# Patient Record
Sex: Male | Born: 1940 | Race: White | Hispanic: No
Health system: Southern US, Community
[De-identification: ages and names within clinical notes are randomized; demographics above are authoritative.]

## PROBLEM LIST (undated history)

## (undated) DIAGNOSIS — N189 Chronic kidney disease, unspecified: Secondary | ICD-10-CM

## (undated) DIAGNOSIS — C3490 Malignant neoplasm of unspecified part of unspecified bronchus or lung: Secondary | ICD-10-CM

---

## 2014-09-08 ENCOUNTER — Other Ambulatory Visit (HOSPITAL_COMMUNITY): Payer: Self-pay | Admitting: Internal Medicine

## 2014-09-08 DIAGNOSIS — C3411 Malignant neoplasm of upper lobe, right bronchus or lung: Secondary | ICD-10-CM

## 2014-09-14 ENCOUNTER — Encounter (HOSPITAL_COMMUNITY)
Admission: RE | Admit: 2014-09-14 | Discharge: 2014-09-14 | Disposition: A | Discharge status: Home / Self Care | Payer: Medicare Other | Source: Ambulatory Visit | Attending: Internal Medicine | Admitting: Internal Medicine

## 2014-09-14 DIAGNOSIS — C3411 Malignant neoplasm of upper lobe, right bronchus or lung: Secondary | ICD-10-CM | POA: Insufficient documentation

## 2014-09-14 LAB — GLUCOSE, CAPILLARY: Glucose-Capillary: 175 mg/dL — ABNORMAL HIGH (ref 70–99)

## 2014-09-14 MED ORDER — FLUDEOXYGLUCOSE F - 18 (FDG) INJECTION
11.3000 | Freq: Once | INTRAVENOUS | Status: AC | PRN
  Administered 2014-09-14: 11.3 via INTRAVENOUS

## 2015-01-05 ENCOUNTER — Other Ambulatory Visit (HOSPITAL_COMMUNITY): Payer: Self-pay | Admitting: Internal Medicine

## 2015-01-05 DIAGNOSIS — C3490 Malignant neoplasm of unspecified part of unspecified bronchus or lung: Secondary | ICD-10-CM

## 2015-02-07 ENCOUNTER — Ambulatory Visit (HOSPITAL_COMMUNITY): Payer: Medicare Other

## 2015-02-16 ENCOUNTER — Ambulatory Visit (HOSPITAL_COMMUNITY)
Admission: RE | Admit: 2015-02-16 | Discharge: 2015-02-16 | Disposition: A | Discharge status: Home / Self Care | Payer: Medicare Other | Source: Ambulatory Visit | Attending: Internal Medicine | Admitting: Internal Medicine

## 2015-02-16 DIAGNOSIS — C3490 Malignant neoplasm of unspecified part of unspecified bronchus or lung: Secondary | ICD-10-CM

## 2015-02-16 DIAGNOSIS — R911 Solitary pulmonary nodule: Secondary | ICD-10-CM | POA: Diagnosis not present

## 2015-02-16 DIAGNOSIS — D3501 Benign neoplasm of right adrenal gland: Secondary | ICD-10-CM | POA: Diagnosis not present

## 2015-02-16 LAB — GLUCOSE, CAPILLARY: Glucose-Capillary: 162 mg/dL — ABNORMAL HIGH (ref 65–99)

## 2015-02-16 MED ORDER — FLUDEOXYGLUCOSE F - 18 (FDG) INJECTION
11.8700 | Freq: Once | INTRAVENOUS | Status: DC | PRN
  Administered 2015-02-16: 11.87 via INTRAVENOUS
  Filled 2015-02-16: qty 11.87

## 2015-08-08 DIAGNOSIS — I10 Essential (primary) hypertension: Secondary | ICD-10-CM | POA: Diagnosis not present

## 2015-08-08 DIAGNOSIS — H8113 Benign paroxysmal vertigo, bilateral: Secondary | ICD-10-CM | POA: Diagnosis not present

## 2015-08-08 DIAGNOSIS — Z6828 Body mass index (BMI) 28.0-28.9, adult: Secondary | ICD-10-CM | POA: Diagnosis not present

## 2015-08-08 DIAGNOSIS — E1121 Type 2 diabetes mellitus with diabetic nephropathy: Secondary | ICD-10-CM | POA: Diagnosis not present

## 2015-08-08 DIAGNOSIS — E1129 Type 2 diabetes mellitus with other diabetic kidney complication: Secondary | ICD-10-CM | POA: Diagnosis not present

## 2015-08-21 DIAGNOSIS — E119 Type 2 diabetes mellitus without complications: Secondary | ICD-10-CM | POA: Diagnosis not present

## 2015-09-14 DIAGNOSIS — R918 Other nonspecific abnormal finding of lung field: Secondary | ICD-10-CM | POA: Diagnosis not present

## 2015-09-14 DIAGNOSIS — Z9221 Personal history of antineoplastic chemotherapy: Secondary | ICD-10-CM | POA: Diagnosis not present

## 2015-09-14 DIAGNOSIS — N62 Hypertrophy of breast: Secondary | ICD-10-CM | POA: Diagnosis not present

## 2015-09-14 DIAGNOSIS — J90 Pleural effusion, not elsewhere classified: Secondary | ICD-10-CM | POA: Diagnosis not present

## 2015-09-14 DIAGNOSIS — I2510 Atherosclerotic heart disease of native coronary artery without angina pectoris: Secondary | ICD-10-CM | POA: Diagnosis not present

## 2015-09-14 DIAGNOSIS — C3490 Malignant neoplasm of unspecified part of unspecified bronchus or lung: Secondary | ICD-10-CM | POA: Diagnosis not present

## 2015-09-14 DIAGNOSIS — Z923 Personal history of irradiation: Secondary | ICD-10-CM | POA: Diagnosis not present

## 2015-09-21 DIAGNOSIS — Z9221 Personal history of antineoplastic chemotherapy: Secondary | ICD-10-CM | POA: Diagnosis not present

## 2015-09-21 DIAGNOSIS — Z923 Personal history of irradiation: Secondary | ICD-10-CM | POA: Diagnosis not present

## 2015-09-21 DIAGNOSIS — N183 Chronic kidney disease, stage 3 (moderate): Secondary | ICD-10-CM | POA: Diagnosis not present

## 2015-09-21 DIAGNOSIS — C3490 Malignant neoplasm of unspecified part of unspecified bronchus or lung: Secondary | ICD-10-CM | POA: Diagnosis not present

## 2015-11-07 DIAGNOSIS — I10 Essential (primary) hypertension: Secondary | ICD-10-CM | POA: Diagnosis not present

## 2015-11-07 DIAGNOSIS — E1121 Type 2 diabetes mellitus with diabetic nephropathy: Secondary | ICD-10-CM | POA: Diagnosis not present

## 2015-11-07 DIAGNOSIS — Z6828 Body mass index (BMI) 28.0-28.9, adult: Secondary | ICD-10-CM | POA: Diagnosis not present

## 2015-11-29 DIAGNOSIS — E119 Type 2 diabetes mellitus without complications: Secondary | ICD-10-CM | POA: Diagnosis not present

## 2016-02-08 DIAGNOSIS — Z1389 Encounter for screening for other disorder: Secondary | ICD-10-CM | POA: Diagnosis not present

## 2016-02-08 DIAGNOSIS — N183 Chronic kidney disease, stage 3 (moderate): Secondary | ICD-10-CM | POA: Diagnosis not present

## 2016-02-08 DIAGNOSIS — E784 Other hyperlipidemia: Secondary | ICD-10-CM | POA: Diagnosis not present

## 2016-02-08 DIAGNOSIS — C3431 Malignant neoplasm of lower lobe, right bronchus or lung: Secondary | ICD-10-CM | POA: Diagnosis not present

## 2016-02-08 DIAGNOSIS — E1122 Type 2 diabetes mellitus with diabetic chronic kidney disease: Secondary | ICD-10-CM | POA: Diagnosis not present

## 2016-02-08 DIAGNOSIS — I10 Essential (primary) hypertension: Secondary | ICD-10-CM | POA: Diagnosis not present

## 2016-02-08 DIAGNOSIS — J440 Chronic obstructive pulmonary disease with acute lower respiratory infection: Secondary | ICD-10-CM | POA: Diagnosis not present

## 2016-02-08 DIAGNOSIS — Z0000 Encounter for general adult medical examination without abnormal findings: Secondary | ICD-10-CM | POA: Diagnosis not present

## 2016-02-08 DIAGNOSIS — Z125 Encounter for screening for malignant neoplasm of prostate: Secondary | ICD-10-CM | POA: Diagnosis not present

## 2016-02-08 DIAGNOSIS — Z6829 Body mass index (BMI) 29.0-29.9, adult: Secondary | ICD-10-CM | POA: Diagnosis not present

## 2016-02-20 DIAGNOSIS — R1084 Generalized abdominal pain: Secondary | ICD-10-CM | POA: Diagnosis not present

## 2016-02-20 DIAGNOSIS — N200 Calculus of kidney: Secondary | ICD-10-CM | POA: Diagnosis not present

## 2016-02-20 DIAGNOSIS — I700 Atherosclerosis of aorta: Secondary | ICD-10-CM | POA: Diagnosis not present

## 2016-02-20 DIAGNOSIS — Z136 Encounter for screening for cardiovascular disorders: Secondary | ICD-10-CM | POA: Diagnosis not present

## 2016-02-20 DIAGNOSIS — Z87891 Personal history of nicotine dependence: Secondary | ICD-10-CM | POA: Diagnosis not present

## 2016-02-20 DIAGNOSIS — Z6828 Body mass index (BMI) 28.0-28.9, adult: Secondary | ICD-10-CM | POA: Diagnosis not present

## 2016-02-20 DIAGNOSIS — I77811 Abdominal aortic ectasia: Secondary | ICD-10-CM | POA: Diagnosis not present

## 2016-02-20 DIAGNOSIS — Z8249 Family history of ischemic heart disease and other diseases of the circulatory system: Secondary | ICD-10-CM | POA: Diagnosis not present

## 2016-02-23 DIAGNOSIS — R1032 Left lower quadrant pain: Secondary | ICD-10-CM | POA: Diagnosis not present

## 2016-02-23 DIAGNOSIS — K5790 Diverticulosis of intestine, part unspecified, without perforation or abscess without bleeding: Secondary | ICD-10-CM | POA: Diagnosis not present

## 2016-02-23 DIAGNOSIS — Z806 Family history of leukemia: Secondary | ICD-10-CM | POA: Diagnosis not present

## 2016-02-23 DIAGNOSIS — Z85118 Personal history of other malignant neoplasm of bronchus and lung: Secondary | ICD-10-CM | POA: Diagnosis not present

## 2016-02-23 DIAGNOSIS — J9383 Other pneumothorax: Secondary | ICD-10-CM | POA: Diagnosis not present

## 2016-02-23 DIAGNOSIS — N189 Chronic kidney disease, unspecified: Secondary | ICD-10-CM | POA: Diagnosis not present

## 2016-02-23 DIAGNOSIS — Z8489 Family history of other specified conditions: Secondary | ICD-10-CM | POA: Diagnosis not present

## 2016-02-23 DIAGNOSIS — E785 Hyperlipidemia, unspecified: Secondary | ICD-10-CM | POA: Diagnosis not present

## 2016-02-23 DIAGNOSIS — N132 Hydronephrosis with renal and ureteral calculous obstruction: Secondary | ICD-10-CM | POA: Diagnosis not present

## 2016-02-23 DIAGNOSIS — I129 Hypertensive chronic kidney disease with stage 1 through stage 4 chronic kidney disease, or unspecified chronic kidney disease: Secondary | ICD-10-CM | POA: Diagnosis not present

## 2016-02-23 DIAGNOSIS — R911 Solitary pulmonary nodule: Secondary | ICD-10-CM | POA: Diagnosis not present

## 2016-02-23 DIAGNOSIS — J449 Chronic obstructive pulmonary disease, unspecified: Secondary | ICD-10-CM | POA: Diagnosis not present

## 2016-02-23 DIAGNOSIS — N183 Chronic kidney disease, stage 3 (moderate): Secondary | ICD-10-CM | POA: Diagnosis not present

## 2016-02-23 DIAGNOSIS — E1122 Type 2 diabetes mellitus with diabetic chronic kidney disease: Secondary | ICD-10-CM | POA: Diagnosis not present

## 2016-02-23 DIAGNOSIS — N201 Calculus of ureter: Secondary | ICD-10-CM | POA: Diagnosis not present

## 2016-02-23 DIAGNOSIS — J90 Pleural effusion, not elsewhere classified: Secondary | ICD-10-CM | POA: Diagnosis not present

## 2016-02-24 DIAGNOSIS — N19 Unspecified kidney failure: Secondary | ICD-10-CM | POA: Diagnosis not present

## 2016-02-24 DIAGNOSIS — J449 Chronic obstructive pulmonary disease, unspecified: Secondary | ICD-10-CM | POA: Diagnosis not present

## 2016-02-24 DIAGNOSIS — N201 Calculus of ureter: Secondary | ICD-10-CM | POA: Diagnosis not present

## 2016-02-27 DIAGNOSIS — E119 Type 2 diabetes mellitus without complications: Secondary | ICD-10-CM | POA: Diagnosis not present

## 2016-03-05 DIAGNOSIS — N200 Calculus of kidney: Secondary | ICD-10-CM | POA: Diagnosis not present

## 2016-03-05 DIAGNOSIS — Z960 Presence of urogenital implants: Secondary | ICD-10-CM | POA: Diagnosis not present

## 2016-03-13 DIAGNOSIS — Z466 Encounter for fitting and adjustment of urinary device: Secondary | ICD-10-CM | POA: Diagnosis not present

## 2016-03-13 DIAGNOSIS — N201 Calculus of ureter: Secondary | ICD-10-CM | POA: Diagnosis not present

## 2016-03-13 DIAGNOSIS — E7800 Pure hypercholesterolemia, unspecified: Secondary | ICD-10-CM | POA: Diagnosis not present

## 2016-03-13 DIAGNOSIS — Z960 Presence of urogenital implants: Secondary | ICD-10-CM | POA: Diagnosis not present

## 2016-03-13 DIAGNOSIS — N200 Calculus of kidney: Secondary | ICD-10-CM | POA: Diagnosis not present

## 2016-03-13 DIAGNOSIS — Z79899 Other long term (current) drug therapy: Secondary | ICD-10-CM | POA: Diagnosis not present

## 2016-03-13 DIAGNOSIS — I10 Essential (primary) hypertension: Secondary | ICD-10-CM | POA: Diagnosis not present

## 2016-03-13 DIAGNOSIS — E119 Type 2 diabetes mellitus without complications: Secondary | ICD-10-CM | POA: Diagnosis not present

## 2016-03-13 DIAGNOSIS — Z794 Long term (current) use of insulin: Secondary | ICD-10-CM | POA: Diagnosis not present

## 2016-03-13 DIAGNOSIS — Z87442 Personal history of urinary calculi: Secondary | ICD-10-CM | POA: Diagnosis not present

## 2016-03-13 DIAGNOSIS — Z85118 Personal history of other malignant neoplasm of bronchus and lung: Secondary | ICD-10-CM | POA: Diagnosis not present

## 2016-03-21 DIAGNOSIS — Z9221 Personal history of antineoplastic chemotherapy: Secondary | ICD-10-CM | POA: Diagnosis not present

## 2016-03-21 DIAGNOSIS — J189 Pneumonia, unspecified organism: Secondary | ICD-10-CM | POA: Diagnosis not present

## 2016-03-21 DIAGNOSIS — Z923 Personal history of irradiation: Secondary | ICD-10-CM | POA: Diagnosis not present

## 2016-03-21 DIAGNOSIS — D3502 Benign neoplasm of left adrenal gland: Secondary | ICD-10-CM | POA: Diagnosis not present

## 2016-03-21 DIAGNOSIS — C3431 Malignant neoplasm of lower lobe, right bronchus or lung: Secondary | ICD-10-CM | POA: Diagnosis not present

## 2016-03-21 DIAGNOSIS — D3501 Benign neoplasm of right adrenal gland: Secondary | ICD-10-CM | POA: Diagnosis not present

## 2016-03-21 DIAGNOSIS — I272 Other secondary pulmonary hypertension: Secondary | ICD-10-CM | POA: Diagnosis not present

## 2016-03-21 DIAGNOSIS — I700 Atherosclerosis of aorta: Secondary | ICD-10-CM | POA: Diagnosis not present

## 2016-03-21 DIAGNOSIS — I313 Pericardial effusion (noninflammatory): Secondary | ICD-10-CM | POA: Diagnosis not present

## 2016-03-27 DIAGNOSIS — J90 Pleural effusion, not elsewhere classified: Secondary | ICD-10-CM | POA: Diagnosis not present

## 2016-04-08 DIAGNOSIS — Z23 Encounter for immunization: Secondary | ICD-10-CM | POA: Diagnosis not present

## 2016-04-26 DIAGNOSIS — N200 Calculus of kidney: Secondary | ICD-10-CM | POA: Diagnosis not present

## 2016-04-26 DIAGNOSIS — C3490 Malignant neoplasm of unspecified part of unspecified bronchus or lung: Secondary | ICD-10-CM | POA: Diagnosis not present

## 2016-04-30 DIAGNOSIS — N209 Urinary calculus, unspecified: Secondary | ICD-10-CM | POA: Diagnosis not present

## 2016-04-30 DIAGNOSIS — N200 Calculus of kidney: Secondary | ICD-10-CM | POA: Diagnosis not present

## 2016-05-02 DIAGNOSIS — N200 Calculus of kidney: Secondary | ICD-10-CM | POA: Diagnosis not present

## 2016-05-06 DIAGNOSIS — E1122 Type 2 diabetes mellitus with diabetic chronic kidney disease: Secondary | ICD-10-CM | POA: Diagnosis not present

## 2016-05-06 DIAGNOSIS — E784 Other hyperlipidemia: Secondary | ICD-10-CM | POA: Diagnosis not present

## 2016-05-06 DIAGNOSIS — Z6829 Body mass index (BMI) 29.0-29.9, adult: Secondary | ICD-10-CM | POA: Diagnosis not present

## 2016-05-06 DIAGNOSIS — I10 Essential (primary) hypertension: Secondary | ICD-10-CM | POA: Diagnosis not present

## 2016-06-22 DIAGNOSIS — M25422 Effusion, left elbow: Secondary | ICD-10-CM | POA: Diagnosis not present

## 2016-06-22 DIAGNOSIS — S59902A Unspecified injury of left elbow, initial encounter: Secondary | ICD-10-CM | POA: Diagnosis not present

## 2016-06-25 DIAGNOSIS — E119 Type 2 diabetes mellitus without complications: Secondary | ICD-10-CM | POA: Diagnosis not present

## 2016-06-25 DIAGNOSIS — Z6829 Body mass index (BMI) 29.0-29.9, adult: Secondary | ICD-10-CM | POA: Diagnosis not present

## 2016-06-25 DIAGNOSIS — M25522 Pain in left elbow: Secondary | ICD-10-CM | POA: Diagnosis not present

## 2016-07-08 DIAGNOSIS — N200 Calculus of kidney: Secondary | ICD-10-CM | POA: Diagnosis not present

## 2016-08-05 DIAGNOSIS — E784 Other hyperlipidemia: Secondary | ICD-10-CM | POA: Diagnosis not present

## 2016-08-05 DIAGNOSIS — M1711 Unilateral primary osteoarthritis, right knee: Secondary | ICD-10-CM | POA: Diagnosis not present

## 2016-08-05 DIAGNOSIS — E1122 Type 2 diabetes mellitus with diabetic chronic kidney disease: Secondary | ICD-10-CM | POA: Diagnosis not present

## 2016-08-05 DIAGNOSIS — M545 Low back pain: Secondary | ICD-10-CM | POA: Diagnosis not present

## 2016-08-05 DIAGNOSIS — E1165 Type 2 diabetes mellitus with hyperglycemia: Secondary | ICD-10-CM | POA: Diagnosis not present

## 2016-08-05 DIAGNOSIS — I10 Essential (primary) hypertension: Secondary | ICD-10-CM | POA: Diagnosis not present

## 2016-10-03 DIAGNOSIS — E784 Other hyperlipidemia: Secondary | ICD-10-CM | POA: Diagnosis not present

## 2016-10-03 DIAGNOSIS — J440 Chronic obstructive pulmonary disease with acute lower respiratory infection: Secondary | ICD-10-CM | POA: Diagnosis not present

## 2016-10-03 DIAGNOSIS — M545 Low back pain: Secondary | ICD-10-CM | POA: Diagnosis not present

## 2016-10-03 DIAGNOSIS — E1122 Type 2 diabetes mellitus with diabetic chronic kidney disease: Secondary | ICD-10-CM | POA: Diagnosis not present

## 2016-12-05 DIAGNOSIS — E1122 Type 2 diabetes mellitus with diabetic chronic kidney disease: Secondary | ICD-10-CM | POA: Diagnosis not present

## 2016-12-05 DIAGNOSIS — J168 Pneumonia due to other specified infectious organisms: Secondary | ICD-10-CM | POA: Diagnosis not present

## 2016-12-05 DIAGNOSIS — J189 Pneumonia, unspecified organism: Secondary | ICD-10-CM | POA: Diagnosis not present

## 2016-12-05 DIAGNOSIS — N183 Chronic kidney disease, stage 3 (moderate): Secondary | ICD-10-CM | POA: Diagnosis not present

## 2016-12-05 DIAGNOSIS — I130 Hypertensive heart and chronic kidney disease with heart failure and stage 1 through stage 4 chronic kidney disease, or unspecified chronic kidney disease: Secondary | ICD-10-CM | POA: Diagnosis not present

## 2016-12-05 DIAGNOSIS — C3491 Malignant neoplasm of unspecified part of right bronchus or lung: Secondary | ICD-10-CM | POA: Diagnosis not present

## 2016-12-05 DIAGNOSIS — E785 Hyperlipidemia, unspecified: Secondary | ICD-10-CM | POA: Diagnosis not present

## 2016-12-05 DIAGNOSIS — Z794 Long term (current) use of insulin: Secondary | ICD-10-CM | POA: Diagnosis not present

## 2016-12-05 DIAGNOSIS — Z79891 Long term (current) use of opiate analgesic: Secondary | ICD-10-CM | POA: Diagnosis not present

## 2016-12-05 DIAGNOSIS — J440 Chronic obstructive pulmonary disease with acute lower respiratory infection: Secondary | ICD-10-CM | POA: Diagnosis not present

## 2016-12-05 DIAGNOSIS — I5031 Acute diastolic (congestive) heart failure: Secondary | ICD-10-CM | POA: Diagnosis not present

## 2016-12-05 DIAGNOSIS — J449 Chronic obstructive pulmonary disease, unspecified: Secondary | ICD-10-CM | POA: Diagnosis not present

## 2016-12-05 DIAGNOSIS — Z85118 Personal history of other malignant neoplasm of bronchus and lung: Secondary | ICD-10-CM | POA: Diagnosis not present

## 2016-12-05 DIAGNOSIS — R0602 Shortness of breath: Secondary | ICD-10-CM | POA: Diagnosis not present

## 2016-12-06 DIAGNOSIS — J449 Chronic obstructive pulmonary disease, unspecified: Secondary | ICD-10-CM | POA: Diagnosis not present

## 2016-12-06 DIAGNOSIS — J440 Chronic obstructive pulmonary disease with acute lower respiratory infection: Secondary | ICD-10-CM | POA: Diagnosis not present

## 2016-12-06 DIAGNOSIS — I5031 Acute diastolic (congestive) heart failure: Secondary | ICD-10-CM | POA: Diagnosis not present

## 2016-12-06 DIAGNOSIS — N183 Chronic kidney disease, stage 3 (moderate): Secondary | ICD-10-CM | POA: Diagnosis not present

## 2016-12-06 DIAGNOSIS — J168 Pneumonia due to other specified infectious organisms: Secondary | ICD-10-CM | POA: Diagnosis not present

## 2016-12-09 DIAGNOSIS — J449 Chronic obstructive pulmonary disease, unspecified: Secondary | ICD-10-CM | POA: Diagnosis not present

## 2016-12-09 DIAGNOSIS — C3491 Malignant neoplasm of unspecified part of right bronchus or lung: Secondary | ICD-10-CM | POA: Diagnosis not present

## 2016-12-10 DIAGNOSIS — J168 Pneumonia due to other specified infectious organisms: Secondary | ICD-10-CM | POA: Diagnosis not present

## 2016-12-10 DIAGNOSIS — J440 Chronic obstructive pulmonary disease with acute lower respiratory infection: Secondary | ICD-10-CM | POA: Diagnosis not present

## 2016-12-10 DIAGNOSIS — I5031 Acute diastolic (congestive) heart failure: Secondary | ICD-10-CM | POA: Diagnosis not present

## 2016-12-10 DIAGNOSIS — N183 Chronic kidney disease, stage 3 (moderate): Secondary | ICD-10-CM | POA: Diagnosis not present

## 2016-12-12 DIAGNOSIS — I5040 Unspecified combined systolic (congestive) and diastolic (congestive) heart failure: Secondary | ICD-10-CM | POA: Diagnosis not present

## 2016-12-12 DIAGNOSIS — D0220 Carcinoma in situ of unspecified bronchus and lung: Secondary | ICD-10-CM | POA: Diagnosis not present

## 2016-12-13 DIAGNOSIS — C3490 Malignant neoplasm of unspecified part of unspecified bronchus or lung: Secondary | ICD-10-CM | POA: Diagnosis not present

## 2016-12-13 DIAGNOSIS — N183 Chronic kidney disease, stage 3 (moderate): Secondary | ICD-10-CM | POA: Diagnosis not present

## 2016-12-13 DIAGNOSIS — I130 Hypertensive heart and chronic kidney disease with heart failure and stage 1 through stage 4 chronic kidney disease, or unspecified chronic kidney disease: Secondary | ICD-10-CM | POA: Diagnosis not present

## 2016-12-13 DIAGNOSIS — Z923 Personal history of irradiation: Secondary | ICD-10-CM | POA: Diagnosis not present

## 2016-12-13 DIAGNOSIS — J440 Chronic obstructive pulmonary disease with acute lower respiratory infection: Secondary | ICD-10-CM | POA: Diagnosis not present

## 2016-12-13 DIAGNOSIS — Z9221 Personal history of antineoplastic chemotherapy: Secondary | ICD-10-CM | POA: Diagnosis not present

## 2016-12-13 DIAGNOSIS — J189 Pneumonia, unspecified organism: Secondary | ICD-10-CM | POA: Diagnosis not present

## 2016-12-13 DIAGNOSIS — I509 Heart failure, unspecified: Secondary | ICD-10-CM | POA: Diagnosis not present

## 2016-12-13 DIAGNOSIS — Z794 Long term (current) use of insulin: Secondary | ICD-10-CM | POA: Diagnosis not present

## 2016-12-13 DIAGNOSIS — E1122 Type 2 diabetes mellitus with diabetic chronic kidney disease: Secondary | ICD-10-CM | POA: Diagnosis not present

## 2016-12-19 DIAGNOSIS — J158 Pneumonia due to other specified bacteria: Secondary | ICD-10-CM | POA: Diagnosis not present

## 2016-12-19 DIAGNOSIS — D0220 Carcinoma in situ of unspecified bronchus and lung: Secondary | ICD-10-CM | POA: Diagnosis not present

## 2016-12-19 DIAGNOSIS — I5040 Unspecified combined systolic (congestive) and diastolic (congestive) heart failure: Secondary | ICD-10-CM | POA: Diagnosis not present

## 2016-12-19 DIAGNOSIS — I10 Essential (primary) hypertension: Secondary | ICD-10-CM | POA: Diagnosis not present

## 2016-12-19 DIAGNOSIS — I5031 Acute diastolic (congestive) heart failure: Secondary | ICD-10-CM | POA: Diagnosis not present

## 2016-12-19 DIAGNOSIS — N183 Chronic kidney disease, stage 3 (moderate): Secondary | ICD-10-CM | POA: Diagnosis not present

## 2016-12-19 DIAGNOSIS — Z6828 Body mass index (BMI) 28.0-28.9, adult: Secondary | ICD-10-CM | POA: Diagnosis not present

## 2016-12-25 DIAGNOSIS — E1165 Type 2 diabetes mellitus with hyperglycemia: Secondary | ICD-10-CM | POA: Diagnosis not present

## 2016-12-25 DIAGNOSIS — E119 Type 2 diabetes mellitus without complications: Secondary | ICD-10-CM | POA: Diagnosis not present

## 2017-01-03 DIAGNOSIS — J158 Pneumonia due to other specified bacteria: Secondary | ICD-10-CM | POA: Diagnosis not present

## 2017-01-03 DIAGNOSIS — I5032 Chronic diastolic (congestive) heart failure: Secondary | ICD-10-CM | POA: Diagnosis not present

## 2017-01-03 DIAGNOSIS — Z6828 Body mass index (BMI) 28.0-28.9, adult: Secondary | ICD-10-CM | POA: Diagnosis not present

## 2017-01-11 DIAGNOSIS — I5040 Unspecified combined systolic (congestive) and diastolic (congestive) heart failure: Secondary | ICD-10-CM | POA: Diagnosis not present

## 2017-01-11 DIAGNOSIS — D0220 Carcinoma in situ of unspecified bronchus and lung: Secondary | ICD-10-CM | POA: Diagnosis not present

## 2017-02-05 DIAGNOSIS — J811 Chronic pulmonary edema: Secondary | ICD-10-CM | POA: Diagnosis not present

## 2017-02-11 DIAGNOSIS — D0220 Carcinoma in situ of unspecified bronchus and lung: Secondary | ICD-10-CM | POA: Diagnosis not present

## 2017-02-11 DIAGNOSIS — I5040 Unspecified combined systolic (congestive) and diastolic (congestive) heart failure: Secondary | ICD-10-CM | POA: Diagnosis not present

## 2017-03-06 DIAGNOSIS — I5032 Chronic diastolic (congestive) heart failure: Secondary | ICD-10-CM | POA: Diagnosis not present

## 2017-03-06 DIAGNOSIS — I10 Essential (primary) hypertension: Secondary | ICD-10-CM | POA: Diagnosis not present

## 2017-03-06 DIAGNOSIS — E1122 Type 2 diabetes mellitus with diabetic chronic kidney disease: Secondary | ICD-10-CM | POA: Diagnosis not present

## 2017-03-06 DIAGNOSIS — E784 Other hyperlipidemia: Secondary | ICD-10-CM | POA: Diagnosis not present

## 2017-03-12 DIAGNOSIS — E119 Type 2 diabetes mellitus without complications: Secondary | ICD-10-CM | POA: Diagnosis not present

## 2017-03-14 DIAGNOSIS — I5040 Unspecified combined systolic (congestive) and diastolic (congestive) heart failure: Secondary | ICD-10-CM | POA: Diagnosis not present

## 2017-03-14 DIAGNOSIS — D0220 Carcinoma in situ of unspecified bronchus and lung: Secondary | ICD-10-CM | POA: Diagnosis not present

## 2017-04-13 DIAGNOSIS — I5040 Unspecified combined systolic (congestive) and diastolic (congestive) heart failure: Secondary | ICD-10-CM | POA: Diagnosis not present

## 2017-04-13 DIAGNOSIS — D0220 Carcinoma in situ of unspecified bronchus and lung: Secondary | ICD-10-CM | POA: Diagnosis not present

## 2017-05-14 DIAGNOSIS — E119 Type 2 diabetes mellitus without complications: Secondary | ICD-10-CM | POA: Diagnosis not present

## 2017-06-05 DIAGNOSIS — E1122 Type 2 diabetes mellitus with diabetic chronic kidney disease: Secondary | ICD-10-CM | POA: Diagnosis not present

## 2017-06-05 DIAGNOSIS — I10 Essential (primary) hypertension: Secondary | ICD-10-CM | POA: Diagnosis not present

## 2017-06-05 DIAGNOSIS — I872 Venous insufficiency (chronic) (peripheral): Secondary | ICD-10-CM | POA: Diagnosis not present

## 2017-06-05 DIAGNOSIS — I5032 Chronic diastolic (congestive) heart failure: Secondary | ICD-10-CM | POA: Diagnosis not present

## 2017-06-05 DIAGNOSIS — E7849 Other hyperlipidemia: Secondary | ICD-10-CM | POA: Diagnosis not present

## 2017-06-13 DIAGNOSIS — R600 Localized edema: Secondary | ICD-10-CM | POA: Diagnosis not present

## 2017-09-08 DIAGNOSIS — Z6829 Body mass index (BMI) 29.0-29.9, adult: Secondary | ICD-10-CM | POA: Diagnosis not present

## 2017-09-08 DIAGNOSIS — I10 Essential (primary) hypertension: Secondary | ICD-10-CM | POA: Diagnosis not present

## 2017-09-08 DIAGNOSIS — Z0000 Encounter for general adult medical examination without abnormal findings: Secondary | ICD-10-CM | POA: Diagnosis not present

## 2017-09-08 DIAGNOSIS — E1122 Type 2 diabetes mellitus with diabetic chronic kidney disease: Secondary | ICD-10-CM | POA: Diagnosis not present

## 2017-09-08 DIAGNOSIS — I5032 Chronic diastolic (congestive) heart failure: Secondary | ICD-10-CM | POA: Diagnosis not present

## 2017-09-08 DIAGNOSIS — I872 Venous insufficiency (chronic) (peripheral): Secondary | ICD-10-CM | POA: Diagnosis not present

## 2017-11-06 DIAGNOSIS — E1165 Type 2 diabetes mellitus with hyperglycemia: Secondary | ICD-10-CM | POA: Diagnosis not present

## 2017-12-08 DIAGNOSIS — I10 Essential (primary) hypertension: Secondary | ICD-10-CM | POA: Diagnosis not present

## 2017-12-08 DIAGNOSIS — I872 Venous insufficiency (chronic) (peripheral): Secondary | ICD-10-CM | POA: Diagnosis not present

## 2017-12-08 DIAGNOSIS — E1122 Type 2 diabetes mellitus with diabetic chronic kidney disease: Secondary | ICD-10-CM | POA: Diagnosis not present

## 2017-12-08 DIAGNOSIS — E7849 Other hyperlipidemia: Secondary | ICD-10-CM | POA: Diagnosis not present

## 2018-02-06 DIAGNOSIS — M19042 Primary osteoarthritis, left hand: Secondary | ICD-10-CM | POA: Diagnosis not present

## 2018-02-06 DIAGNOSIS — I10 Essential (primary) hypertension: Secondary | ICD-10-CM | POA: Diagnosis not present

## 2018-02-06 DIAGNOSIS — Z79899 Other long term (current) drug therapy: Secondary | ICD-10-CM | POA: Diagnosis not present

## 2018-02-06 DIAGNOSIS — Z87891 Personal history of nicotine dependence: Secondary | ICD-10-CM | POA: Diagnosis not present

## 2018-02-06 DIAGNOSIS — M79645 Pain in left finger(s): Secondary | ICD-10-CM | POA: Diagnosis not present

## 2018-02-06 DIAGNOSIS — E7800 Pure hypercholesterolemia, unspecified: Secondary | ICD-10-CM | POA: Diagnosis not present

## 2018-02-06 DIAGNOSIS — M7989 Other specified soft tissue disorders: Secondary | ICD-10-CM | POA: Diagnosis not present

## 2018-02-06 DIAGNOSIS — E119 Type 2 diabetes mellitus without complications: Secondary | ICD-10-CM | POA: Diagnosis not present

## 2018-02-06 DIAGNOSIS — M19041 Primary osteoarthritis, right hand: Secondary | ICD-10-CM | POA: Diagnosis not present

## 2018-02-06 DIAGNOSIS — Z85118 Personal history of other malignant neoplasm of bronchus and lung: Secondary | ICD-10-CM | POA: Diagnosis not present

## 2018-02-06 DIAGNOSIS — M25421 Effusion, right elbow: Secondary | ICD-10-CM | POA: Diagnosis not present

## 2018-02-06 DIAGNOSIS — M19021 Primary osteoarthritis, right elbow: Secondary | ICD-10-CM | POA: Diagnosis not present

## 2018-02-06 DIAGNOSIS — Z794 Long term (current) use of insulin: Secondary | ICD-10-CM | POA: Diagnosis not present

## 2018-02-09 DIAGNOSIS — E7849 Other hyperlipidemia: Secondary | ICD-10-CM | POA: Diagnosis not present

## 2018-02-09 DIAGNOSIS — M25441 Effusion, right hand: Secondary | ICD-10-CM | POA: Diagnosis not present

## 2018-02-09 DIAGNOSIS — Z6829 Body mass index (BMI) 29.0-29.9, adult: Secondary | ICD-10-CM | POA: Diagnosis not present

## 2018-02-09 DIAGNOSIS — I872 Venous insufficiency (chronic) (peripheral): Secondary | ICD-10-CM | POA: Diagnosis not present

## 2018-03-09 DIAGNOSIS — M25441 Effusion, right hand: Secondary | ICD-10-CM | POA: Diagnosis not present

## 2018-03-09 DIAGNOSIS — Z6829 Body mass index (BMI) 29.0-29.9, adult: Secondary | ICD-10-CM | POA: Diagnosis not present

## 2018-03-09 DIAGNOSIS — E1165 Type 2 diabetes mellitus with hyperglycemia: Secondary | ICD-10-CM | POA: Diagnosis not present

## 2018-03-09 DIAGNOSIS — Z0000 Encounter for general adult medical examination without abnormal findings: Secondary | ICD-10-CM | POA: Diagnosis not present

## 2018-03-09 DIAGNOSIS — Z6830 Body mass index (BMI) 30.0-30.9, adult: Secondary | ICD-10-CM | POA: Diagnosis not present

## 2018-04-10 DIAGNOSIS — E1165 Type 2 diabetes mellitus with hyperglycemia: Secondary | ICD-10-CM | POA: Diagnosis not present

## 2018-04-23 DIAGNOSIS — E11319 Type 2 diabetes mellitus with unspecified diabetic retinopathy without macular edema: Secondary | ICD-10-CM | POA: Diagnosis not present

## 2018-05-19 DIAGNOSIS — E1165 Type 2 diabetes mellitus with hyperglycemia: Secondary | ICD-10-CM | POA: Diagnosis not present

## 2018-06-08 DIAGNOSIS — J680 Bronchitis and pneumonitis due to chemicals, gases, fumes and vapors: Secondary | ICD-10-CM | POA: Diagnosis not present

## 2018-06-08 DIAGNOSIS — Z6830 Body mass index (BMI) 30.0-30.9, adult: Secondary | ICD-10-CM | POA: Diagnosis not present

## 2018-06-08 DIAGNOSIS — I10 Essential (primary) hypertension: Secondary | ICD-10-CM | POA: Diagnosis not present

## 2018-06-08 DIAGNOSIS — E1121 Type 2 diabetes mellitus with diabetic nephropathy: Secondary | ICD-10-CM | POA: Diagnosis not present

## 2018-06-09 DIAGNOSIS — R0602 Shortness of breath: Secondary | ICD-10-CM | POA: Diagnosis not present

## 2018-06-09 DIAGNOSIS — J449 Chronic obstructive pulmonary disease, unspecified: Secondary | ICD-10-CM | POA: Diagnosis not present

## 2018-06-17 DIAGNOSIS — J441 Chronic obstructive pulmonary disease with (acute) exacerbation: Secondary | ICD-10-CM | POA: Diagnosis not present

## 2018-06-20 DIAGNOSIS — E1165 Type 2 diabetes mellitus with hyperglycemia: Secondary | ICD-10-CM | POA: Diagnosis not present

## 2018-08-31 DIAGNOSIS — Z6830 Body mass index (BMI) 30.0-30.9, adult: Secondary | ICD-10-CM | POA: Diagnosis not present

## 2018-08-31 DIAGNOSIS — I5021 Acute systolic (congestive) heart failure: Secondary | ICD-10-CM | POA: Diagnosis not present

## 2018-09-04 DIAGNOSIS — Z6830 Body mass index (BMI) 30.0-30.9, adult: Secondary | ICD-10-CM | POA: Diagnosis not present

## 2018-09-04 DIAGNOSIS — I5021 Acute systolic (congestive) heart failure: Secondary | ICD-10-CM | POA: Diagnosis not present

## 2018-09-10 DIAGNOSIS — I348 Other nonrheumatic mitral valve disorders: Secondary | ICD-10-CM | POA: Diagnosis not present

## 2018-09-10 DIAGNOSIS — I517 Cardiomegaly: Secondary | ICD-10-CM | POA: Diagnosis not present

## 2018-09-10 DIAGNOSIS — I77819 Aortic ectasia, unspecified site: Secondary | ICD-10-CM | POA: Diagnosis not present

## 2018-09-10 DIAGNOSIS — I358 Other nonrheumatic aortic valve disorders: Secondary | ICD-10-CM | POA: Diagnosis not present

## 2018-09-10 DIAGNOSIS — I5021 Acute systolic (congestive) heart failure: Secondary | ICD-10-CM | POA: Diagnosis not present

## 2018-09-14 DIAGNOSIS — I5021 Acute systolic (congestive) heart failure: Secondary | ICD-10-CM | POA: Diagnosis not present

## 2018-09-14 DIAGNOSIS — Z6833 Body mass index (BMI) 33.0-33.9, adult: Secondary | ICD-10-CM | POA: Diagnosis not present

## 2018-09-14 DIAGNOSIS — J449 Chronic obstructive pulmonary disease, unspecified: Secondary | ICD-10-CM | POA: Diagnosis not present

## 2018-11-18 ENCOUNTER — Inpatient Hospital Stay (HOSPITAL_COMMUNITY): Payer: Medicare Other

## 2018-11-18 ENCOUNTER — Inpatient Hospital Stay (HOSPITAL_COMMUNITY)
Admission: AD | Admit: 2018-11-18 | Discharge: 2018-11-30 | DRG: 207 | Disposition: E | Payer: Medicare Other | Source: Other Acute Inpatient Hospital | Attending: Pulmonary Disease | Admitting: Pulmonary Disease

## 2018-11-18 DIAGNOSIS — J440 Chronic obstructive pulmonary disease with acute lower respiratory infection: Secondary | ICD-10-CM | POA: Diagnosis present

## 2018-11-18 DIAGNOSIS — E1122 Type 2 diabetes mellitus with diabetic chronic kidney disease: Secondary | ICD-10-CM | POA: Diagnosis present

## 2018-11-18 DIAGNOSIS — I959 Hypotension, unspecified: Secondary | ICD-10-CM | POA: Diagnosis not present

## 2018-11-18 DIAGNOSIS — I313 Pericardial effusion (noninflammatory): Secondary | ICD-10-CM | POA: Diagnosis present

## 2018-11-18 DIAGNOSIS — E1165 Type 2 diabetes mellitus with hyperglycemia: Secondary | ICD-10-CM | POA: Diagnosis not present

## 2018-11-18 DIAGNOSIS — Z923 Personal history of irradiation: Secondary | ICD-10-CM

## 2018-11-18 DIAGNOSIS — I361 Nonrheumatic tricuspid (valve) insufficiency: Secondary | ICD-10-CM | POA: Diagnosis not present

## 2018-11-18 DIAGNOSIS — N179 Acute kidney failure, unspecified: Secondary | ICD-10-CM | POA: Diagnosis present

## 2018-11-18 DIAGNOSIS — D631 Anemia in chronic kidney disease: Secondary | ICD-10-CM | POA: Diagnosis present

## 2018-11-18 DIAGNOSIS — Z85118 Personal history of other malignant neoplasm of bronchus and lung: Secondary | ICD-10-CM

## 2018-11-18 DIAGNOSIS — I252 Old myocardial infarction: Secondary | ICD-10-CM | POA: Diagnosis not present

## 2018-11-18 DIAGNOSIS — Z6830 Body mass index (BMI) 30.0-30.9, adult: Secondary | ICD-10-CM | POA: Diagnosis not present

## 2018-11-18 DIAGNOSIS — C7801 Secondary malignant neoplasm of right lung: Secondary | ICD-10-CM | POA: Diagnosis present

## 2018-11-18 DIAGNOSIS — J9811 Atelectasis: Secondary | ICD-10-CM | POA: Diagnosis not present

## 2018-11-18 DIAGNOSIS — E870 Hyperosmolality and hypernatremia: Secondary | ICD-10-CM | POA: Diagnosis not present

## 2018-11-18 DIAGNOSIS — Z20828 Contact with and (suspected) exposure to other viral communicable diseases: Secondary | ICD-10-CM | POA: Diagnosis present

## 2018-11-18 DIAGNOSIS — J9601 Acute respiratory failure with hypoxia: Secondary | ICD-10-CM | POA: Diagnosis present

## 2018-11-18 DIAGNOSIS — Z515 Encounter for palliative care: Secondary | ICD-10-CM | POA: Diagnosis not present

## 2018-11-18 DIAGNOSIS — J9602 Acute respiratory failure with hypercapnia: Secondary | ICD-10-CM | POA: Diagnosis present

## 2018-11-18 DIAGNOSIS — Z9689 Presence of other specified functional implants: Secondary | ICD-10-CM

## 2018-11-18 DIAGNOSIS — G9341 Metabolic encephalopathy: Secondary | ICD-10-CM | POA: Diagnosis not present

## 2018-11-18 DIAGNOSIS — I5031 Acute diastolic (congestive) heart failure: Secondary | ICD-10-CM | POA: Diagnosis present

## 2018-11-18 DIAGNOSIS — J9690 Respiratory failure, unspecified, unspecified whether with hypoxia or hypercapnia: Secondary | ICD-10-CM

## 2018-11-18 DIAGNOSIS — I2510 Atherosclerotic heart disease of native coronary artery without angina pectoris: Secondary | ICD-10-CM | POA: Diagnosis present

## 2018-11-18 DIAGNOSIS — J189 Pneumonia, unspecified organism: Secondary | ICD-10-CM | POA: Diagnosis present

## 2018-11-18 DIAGNOSIS — Z66 Do not resuscitate: Secondary | ICD-10-CM | POA: Diagnosis not present

## 2018-11-18 DIAGNOSIS — E46 Unspecified protein-calorie malnutrition: Secondary | ICD-10-CM | POA: Diagnosis present

## 2018-11-18 DIAGNOSIS — L8990 Pressure ulcer of unspecified site, unspecified stage: Secondary | ICD-10-CM

## 2018-11-18 DIAGNOSIS — Z9221 Personal history of antineoplastic chemotherapy: Secondary | ICD-10-CM

## 2018-11-18 DIAGNOSIS — Z4659 Encounter for fitting and adjustment of other gastrointestinal appliance and device: Secondary | ICD-10-CM

## 2018-11-18 DIAGNOSIS — N183 Chronic kidney disease, stage 3 (moderate): Secondary | ICD-10-CM | POA: Diagnosis present

## 2018-11-18 DIAGNOSIS — R451 Restlessness and agitation: Secondary | ICD-10-CM | POA: Diagnosis not present

## 2018-11-18 DIAGNOSIS — J9600 Acute respiratory failure, unspecified whether with hypoxia or hypercapnia: Secondary | ICD-10-CM

## 2018-11-18 DIAGNOSIS — I480 Paroxysmal atrial fibrillation: Secondary | ICD-10-CM | POA: Diagnosis present

## 2018-11-18 DIAGNOSIS — Z9049 Acquired absence of other specified parts of digestive tract: Secondary | ICD-10-CM

## 2018-11-18 HISTORY — DX: Malignant neoplasm of unspecified part of unspecified bronchus or lung: C34.90

## 2018-11-18 HISTORY — DX: Chronic kidney disease, unspecified: N18.9

## 2018-11-18 LAB — CBC WITH DIFFERENTIAL/PLATELET
Abs Immature Granulocytes: 0.04 10*3/uL (ref 0.00–0.07)
Basophils Absolute: 0 10*3/uL (ref 0.0–0.1)
Basophils Relative: 0 %
Eosinophils Absolute: 0 10*3/uL (ref 0.0–0.5)
Eosinophils Relative: 0 %
HCT: 36 % — ABNORMAL LOW (ref 39.0–52.0)
Hemoglobin: 10.2 g/dL — ABNORMAL LOW (ref 13.0–17.0)
Immature Granulocytes: 1 %
Lymphocytes Relative: 12 %
Lymphs Abs: 0.5 10*3/uL — ABNORMAL LOW (ref 0.7–4.0)
MCH: 27.6 pg (ref 26.0–34.0)
MCHC: 28.3 g/dL — ABNORMAL LOW (ref 30.0–36.0)
MCV: 97.6 fL (ref 80.0–100.0)
Monocytes Absolute: 0.3 10*3/uL (ref 0.1–1.0)
Monocytes Relative: 7 %
Neutro Abs: 3.3 10*3/uL (ref 1.7–7.7)
Neutrophils Relative %: 80 %
Platelets: 127 10*3/uL — ABNORMAL LOW (ref 150–400)
RBC: 3.69 MIL/uL — ABNORMAL LOW (ref 4.22–5.81)
RDW: 16 % — ABNORMAL HIGH (ref 11.5–15.5)
WBC: 4.1 10*3/uL (ref 4.0–10.5)
nRBC: 0 % (ref 0.0–0.2)

## 2018-11-18 LAB — COMPREHENSIVE METABOLIC PANEL
ALT: 11 U/L (ref 0–44)
AST: 8 U/L — ABNORMAL LOW (ref 15–41)
Albumin: 2.5 g/dL — ABNORMAL LOW (ref 3.5–5.0)
Alkaline Phosphatase: 128 U/L — ABNORMAL HIGH (ref 38–126)
Anion gap: 11 (ref 5–15)
BUN: 75 mg/dL — ABNORMAL HIGH (ref 8–23)
CO2: 26 mmol/L (ref 22–32)
Calcium: 8.5 mg/dL — ABNORMAL LOW (ref 8.9–10.3)
Chloride: 103 mmol/L (ref 98–111)
Creatinine, Ser: 3.89 mg/dL — ABNORMAL HIGH (ref 0.61–1.24)
GFR calc Af Amer: 16 mL/min — ABNORMAL LOW (ref >60)
GFR calc non Af Amer: 14 mL/min — ABNORMAL LOW (ref >60)
Glucose, Bld: 151 mg/dL — ABNORMAL HIGH (ref 70–99)
Potassium: 5.7 mmol/L — ABNORMAL HIGH (ref 3.5–5.1)
Sodium: 140 mmol/L (ref 135–145)
Total Bilirubin: 0.6 mg/dL (ref 0.3–1.2)
Total Protein: 6.7 g/dL (ref 6.5–8.1)

## 2018-11-18 LAB — SARS CORONAVIRUS 2 BY RT PCR (HOSPITAL ORDER, PERFORMED IN ~~LOC~~ HOSPITAL LAB): SARS Coronavirus 2: NEGATIVE

## 2018-11-18 LAB — POCT I-STAT 7, (LYTES, BLD GAS, ICA,H+H)
Acid-base deficit: 1 mmol/L (ref 0.0–2.0)
Bicarbonate: 26.3 mmol/L (ref 20.0–28.0)
Calcium, Ion: 1.19 mmol/L (ref 1.15–1.40)
HCT: 30 % — ABNORMAL LOW (ref 39.0–52.0)
Hemoglobin: 10.2 g/dL — ABNORMAL LOW (ref 13.0–17.0)
O2 Saturation: 98 %
Potassium: 5.4 mmol/L — ABNORMAL HIGH (ref 3.5–5.1)
Sodium: 139 mmol/L (ref 135–145)
TCO2: 28 mmol/L (ref 22–32)
pCO2 arterial: 53.5 mmHg — ABNORMAL HIGH (ref 32.0–48.0)
pH, Arterial: 7.3 — ABNORMAL LOW (ref 7.350–7.450)
pO2, Arterial: 118 mmHg — ABNORMAL HIGH (ref 83.0–108.0)

## 2018-11-18 LAB — APTT: aPTT: 41 seconds — ABNORMAL HIGH (ref 24–36)

## 2018-11-18 LAB — LACTIC ACID, PLASMA
Lactic Acid, Venous: 1.1 mmol/L (ref 0.5–1.9)
Lactic Acid, Venous: 1.2 mmol/L (ref 0.5–1.9)

## 2018-11-18 LAB — GLUCOSE, CAPILLARY: Glucose-Capillary: 146 mg/dL — ABNORMAL HIGH (ref 70–99)

## 2018-11-18 LAB — ECHOCARDIOGRAM COMPLETE
Height: 74 in
Weight: 4123.48 oz

## 2018-11-18 LAB — TROPONIN I: Troponin I: 0.09 ng/mL (ref <0.03)

## 2018-11-18 LAB — BRAIN NATRIURETIC PEPTIDE: B Natriuretic Peptide: 1065.3 pg/mL — ABNORMAL HIGH (ref 0.0–100.0)

## 2018-11-18 LAB — PROTIME-INR
INR: 1.2 (ref 0.8–1.2)
Prothrombin Time: 15.2 seconds (ref 11.4–15.2)

## 2018-11-18 LAB — MRSA PCR SCREENING: MRSA by PCR: NEGATIVE

## 2018-11-18 LAB — PROCALCITONIN: Procalcitonin: 0.21 ng/mL

## 2018-11-18 LAB — PHOSPHORUS: Phosphorus: 5.5 mg/dL — ABNORMAL HIGH (ref 2.5–4.6)

## 2018-11-18 LAB — MAGNESIUM: Magnesium: 2 mg/dL (ref 1.7–2.4)

## 2018-11-18 MED ORDER — MIDAZOLAM HCL 2 MG/2ML IJ SOLN: 1.0000 mg | INTRAMUSCULAR | Status: DC | PRN

## 2018-11-18 MED ORDER — FENTANYL BOLUS VIA INFUSION
25.0000 ug | INTRAVENOUS | Status: DC | PRN
  Administered 2018-11-18 – 2018-11-21 (×5): 25 ug via INTRAVENOUS
  Filled 2018-11-18: qty 25

## 2018-11-18 MED ORDER — FUROSEMIDE 10 MG/ML IJ SOLN
100.0000 mg | Freq: Two times a day (BID) | INTRAVENOUS | Status: DC
  Administered 2018-11-18 – 2018-11-19 (×2): 100 mg via INTRAVENOUS
  Filled 2018-11-18 (×3): qty 10

## 2018-11-18 MED ORDER — DEXMEDETOMIDINE HCL IN NACL 400 MCG/100ML IV SOLN
0.0000 ug/kg/h | INTRAVENOUS | Status: AC
  Administered 2018-11-18: 17:00:00 0.4 ug/kg/h via INTRAVENOUS
  Administered 2018-11-19 (×4): 1.1 ug/kg/h via INTRAVENOUS
  Administered 2018-11-19 (×2): 1.2 ug/kg/h via INTRAVENOUS
  Administered 2018-11-20 (×2): 1.1 ug/kg/h via INTRAVENOUS
  Administered 2018-11-20: 1 ug/kg/h via INTRAVENOUS
  Administered 2018-11-20: 1.1 ug/kg/h via INTRAVENOUS
  Administered 2018-11-20: 1 ug/kg/h via INTRAVENOUS
  Administered 2018-11-20 (×2): 1.1 ug/kg/h via INTRAVENOUS
  Administered 2018-11-21: 0.5 ug/kg/h via INTRAVENOUS
  Administered 2018-11-21 (×2): 1.1 ug/kg/h via INTRAVENOUS
  Filled 2018-11-18: qty 100
  Filled 2018-11-18: qty 200
  Filled 2018-11-18 (×16): qty 100
  Filled 2018-11-18: qty 200

## 2018-11-18 MED ORDER — FAMOTIDINE 20 MG PO TABS
20.0000 mg | ORAL_TABLET | Freq: Every day | ORAL | Status: DC
  Administered 2018-11-19 – 2018-11-25 (×7): 20 mg via ORAL
  Filled 2018-11-18 (×7): qty 1

## 2018-11-18 MED ORDER — FENTANYL CITRATE (PF) 100 MCG/2ML IJ SOLN
25.0000 ug | Freq: Once | INTRAMUSCULAR | Status: AC
  Administered 2018-11-18: 16:00:00 25 ug via INTRAVENOUS

## 2018-11-18 MED ORDER — FUROSEMIDE 10 MG/ML IJ SOLN
40.0000 mg | Freq: Two times a day (BID) | INTRAMUSCULAR | Status: DC
  Filled 2018-11-18: qty 4

## 2018-11-18 MED ORDER — CHLORHEXIDINE GLUCONATE 0.12% ORAL RINSE (MEDLINE KIT)
15.0000 mL | Freq: Two times a day (BID) | OROMUCOSAL | Status: DC
  Administered 2018-11-18 – 2018-11-25 (×14): 15 mL via OROMUCOSAL

## 2018-11-18 MED ORDER — FENTANYL 2500MCG IN NS 250ML (10MCG/ML) PREMIX INFUSION
25.0000 ug/h | INTRAVENOUS | Status: DC
  Administered 2018-11-18: 16:00:00 50 ug/h via INTRAVENOUS
  Administered 2018-11-19: 125 ug/h via INTRAVENOUS
  Administered 2018-11-19: 200 ug/h via INTRAVENOUS
  Filled 2018-11-18 (×3): qty 250

## 2018-11-18 MED ORDER — CHLORHEXIDINE GLUCONATE CLOTH 2 % EX PADS
6.0000 | MEDICATED_PAD | Freq: Every day | CUTANEOUS | Status: DC
  Administered 2018-11-18 – 2018-11-25 (×8): 6 via TOPICAL

## 2018-11-18 MED ORDER — FAMOTIDINE 20 MG PO TABS
20.0000 mg | ORAL_TABLET | Freq: Two times a day (BID) | ORAL | Status: DC
  Filled 2018-11-18: qty 1

## 2018-11-18 MED ORDER — ORAL CARE MOUTH RINSE
15.0000 mL | OROMUCOSAL | Status: DC
  Administered 2018-11-18 – 2018-11-25 (×52): 15 mL via OROMUCOSAL

## 2018-11-18 MED ORDER — HEPARIN SODIUM (PORCINE) 5000 UNIT/ML IJ SOLN
5000.0000 [IU] | Freq: Three times a day (TID) | INTRAMUSCULAR | Status: DC
Start: 2018-11-18 — End: 2018-11-22
  Administered 2018-11-18 – 2018-11-22 (×12): 5000 [IU] via SUBCUTANEOUS
  Filled 2018-11-18 (×12): qty 1

## 2018-11-18 MED ORDER — ALBUTEROL SULFATE (2.5 MG/3ML) 0.083% IN NEBU: 2.5000 mg | INHALATION_SOLUTION | RESPIRATORY_TRACT | Status: DC | PRN

## 2018-11-18 MED ORDER — MIDAZOLAM HCL 2 MG/2ML IJ SOLN
2.0000 mg | INTRAMUSCULAR | Status: DC | PRN
  Administered 2018-11-18: 17:00:00 2 mg via INTRAVENOUS
  Filled 2018-11-18 (×3): qty 2

## 2018-11-18 MED ORDER — MIDAZOLAM HCL 2 MG/2ML IJ SOLN
2.0000 mg | INTRAMUSCULAR | Status: DC | PRN
  Administered 2018-11-19 (×2): 2 mg via INTRAVENOUS
  Filled 2018-11-18: qty 2

## 2018-11-18 NOTE — Progress Notes (Signed)
  Echocardiogram 2D Echocardiogram has been performed.  Edward Hahn 11/22/2018, 5:45 PM

## 2018-11-18 NOTE — Progress Notes (Signed)
Pt transported to CT on vent without complication

## 2018-11-18 NOTE — H&P (Addendum)
NAME:  Edward Hahn, MRN:  [id number], DOB:  12-26-1940, LOS: 0 ADMISSION DATE:  11/23/2018, CONSULTATION DATE:  11/14/2018 REFERRING MD:  ED [facility], CHIEF COMPLAINT: Respiratory failure  Brief History   Patient unable to communicate, history from records from [facility] Received in transfer for acute respiratory failure requiring mechanical ventilation.  BNP was elevated at 25,000 there.  History of present illness   78 year old man presents with increased shortness of breath and generalized weakness.  Found to have hypercarbic respiratory failure.  Intubated after failed trial of BiPAP.  PCO2 was up to 99 today. 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 Coronary artery disease and known congestive heart failure and recent admission for non-ST elevation MI. Significant Hospital Events   Transferred to [facility] 5/20  Consults:  PCCM  Procedures:  n/a  Significant Diagnostic Tests:  CXR 5/20 - Right lung opacification. ETT in position. (personally reviewed) - unchanged according to reports from [place]:  n/a  Antimicrobials:  none   Interim history/subjective:  Agitation on arrival.  Objective   Blood pressure 137/62, pulse 95, resp. rate 18, height 6\' 2"  (1.88 m), weight 116.9 kg, SpO2 96 %. PAP: ()/()      No intake or output data in the 24 hours ending 11/09/2018 1606 Filed Weights   11/14/2018 1500  Weight: 116.9 kg   Vent Mode: PRVC FiO2 (%):  [60 %-70 %] 70 % Set Rate:  [18 bmp] 18 bmp Vt Set:  [510 mL-650 mL] 650 mL PEEP:  [5 cmH20] 5 cmH20 Plateau Pressure:  [30 cmH20] 30 cmH20  Examination: General: Chronically ill appearing man  HENT: Endotracheal tube in place, OG tube in place. Lungs: No ventilator asynchrony.  Normal chest excursion.  Vesicular breath sounds throughout without adventitial sounds. Cardiovascular: JVP is elevated.  Heart sounds are distant but unremarkable.  Extremities are warm. Abdomen: Distended but nontender. Extremities: Moderate lower extremity edema.  Pressure ulceration on dorsum of toes left foot Neuro: Does not follow commands.  Periodically agitated. GU: Foley catheter in place with minimal urine output.  I performed a critical care echocardiogram: Technically very difficult study with limited acoustic windows.  LV size normal and function normal to hyperdynamic.  RV dilatation with moderate dysfunction.  Evidence of pressure overload with systolic septal bounce.  Increased echogenicity of the myocardium, especially of the right ventricle which is also thickened.  Moderate circumferential pericardial effusion with no evidence of tamponade.  Impaired diastolic relaxation of LV.  Adequate acoustic windows for Doppler evaluation of the other valves.  There is opacification of the right lung.      Resolved Hospital Problem list   Not applicable  Assessment & Plan:   Critically ill due to respiratory failure requiring mechanical ventilation.  Etiology of respiratory failure unclear -Full ventilatory support -Continue broad-spectrum antibiotics -Trial of diuresis.  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 -Follow serial chest x-rays.  Best practice:  Diet: Low, initiate tube feeds. Pain/Anxiety/Delirium protocol (if indicated): Precedex, Fentanyl infusion with as needed Versed.   RASS -2 to -1 VAP protocol (if indicated): Bundle in place DVT prophylaxis: Unfractionated  heparin GI prophylaxis: Famotidine Glucose control: Phase 1  glycemic protocol Mobility: Bedrest Code Status: Full code Family Communication:  discussed the patient's condition with his daughter-in-law.  She acknowledges the patient has been in declining health.  I have expressed my concern regarding the severity of his condition and pointed out to her that he is currently on life support and that the combination of possible recurrent lung cancer, heart failure, generalized frailty portend a poor prognosis.  Ultimately even if he were to survive this event he would likely return to increasing debility. I told her that I would keep her abreast of his condition and in particular the results of his CAT scan.  I advised her that given his poor prognosis that a trial of aggressive care should be time-limited and should fall short of CPR in case of cardiac arrest which the assented to. Disposition: ICU  Labs   CBC: Recent Labs  Lab 11/19/2018 1606  WBC 4.1  NEUTROABS 3.3  HGB 10.2*  HCT 36.0*  MCV 97.6  PLT 127*    Basic Metabolic Panel: Recent Labs  Lab 11/12/2018 1606  NA 140  K 5.7*  CL 103  CO2 26  GLUCOSE 151*  BUN 75*  CREATININE 3.89*  CALCIUM 8.5*  MG 2.0  PHOS 5.5*   GFR: CrCl cannot be calculated (No successful lab value found.). Recent Labs  Lab 11/22/2018 1606  WBC 4.1  LATICACIDVEN 1.1    Liver Function Tests: Recent Labs  Lab 11/13/2018 1606  AST 8*  ALT 11  ALKPHOS 128*  BILITOT 0.6  PROT 6.7  ALBUMIN 2.5*   No results for input(s): LIPASE, AMYLASE in the last 168 hours. No results for input(s): AMMONIA in the last 168 hours.  ABG No results found for: PHART, PCO2ART, PO2ART, HCO3, TCO2, ACIDBASEDEF, O2SAT   Coagulation Profile: Recent Labs  Lab 11/04/2018 1606  INR 1.2    Cardiac Enzymes: No results for input(s): CKTOTAL, CKMB, CKMBINDEX, TROPONINI in the last 168 hours.  HbA1C: No results found for: HGBA1C  CBG: No results for input(s): GLUCAP in the last 168 hours.  Review of Systems:   Unable to  obtain  Past Medical History  As documented above  Surgical History   Prior appendectomy.  Lung biopsy.  Social History      Family History   His family history is not on file.   Allergies Allergies not on file   Home Medications  Prior to Admission medications   Not on File     CRITICAL CARE Performed by: Kipp Brood   Total critical care time: 50 minutes  Critical care time was exclusive of separately billable procedures and treating other patients.  Critical care was necessary to treat or prevent imminent or life-threatening deterioration.  Critical care was time spent personally by me on the following activities: development of treatment plan with patient and/or surrogate as well as nursing, discussions with consultants, evaluation of patient's response to treatment, examination of patient, obtaining history from patient or surrogate, ordering and performing treatments and interventions, ordering and review of laboratory studies, ordering and review of radiographic studies, pulse oximetry, re-evaluation of patient's condition and participation in multidisciplinary rounds.  Kipp Brood, MD [facility] ICU Physician [place]  Pager: [phone] Mobile: [phone] After hours: [phone].

## 2018-11-18 NOTE — Progress Notes (Signed)
RT attempted to collect tracheal aspirate and was unable to obtain sputum at this time.

## 2018-11-19 ENCOUNTER — Inpatient Hospital Stay (HOSPITAL_COMMUNITY): Payer: Medicare Other

## 2018-11-19 DIAGNOSIS — J9811 Atelectasis: Secondary | ICD-10-CM

## 2018-11-19 DIAGNOSIS — J9601 Acute respiratory failure with hypoxia: Secondary | ICD-10-CM

## 2018-11-19 LAB — POCT I-STAT 7, (LYTES, BLD GAS, ICA,H+H)
Acid-Base Excess: 1 mmol/L (ref 0.0–2.0)
Bicarbonate: 26.2 mmol/L (ref 20.0–28.0)
Calcium, Ion: 1.18 mmol/L (ref 1.15–1.40)
HCT: 35 % — ABNORMAL LOW (ref 39.0–52.0)
Hemoglobin: 11.9 g/dL — ABNORMAL LOW (ref 13.0–17.0)
O2 Saturation: 96 %
Patient temperature: 98.3
Potassium: 4.9 mmol/L (ref 3.5–5.1)
Sodium: 140 mmol/L (ref 135–145)
TCO2: 28 mmol/L (ref 22–32)
pCO2 arterial: 42.9 mmHg (ref 32.0–48.0)
pH, Arterial: 7.394 (ref 7.350–7.450)
pO2, Arterial: 80 mmHg — ABNORMAL LOW (ref 83.0–108.0)

## 2018-11-19 LAB — STREP PNEUMONIAE URINARY ANTIGEN: Strep Pneumo Urinary Antigen: NEGATIVE

## 2018-11-19 LAB — URINE CULTURE: Culture: NO GROWTH

## 2018-11-19 LAB — TROPONIN I
Troponin I: 0.14 ng/mL (ref <0.03)
Troponin I: 0.19 ng/mL (ref <0.03)

## 2018-11-19 LAB — PHOSPHORUS: Phosphorus: 3.2 mg/dL (ref 2.5–4.6)

## 2018-11-19 LAB — GLUCOSE, CAPILLARY
Glucose-Capillary: 184 mg/dL — ABNORMAL HIGH (ref 70–99)
Glucose-Capillary: 186 mg/dL — ABNORMAL HIGH (ref 70–99)
Glucose-Capillary: 170 mg/dL — ABNORMAL HIGH (ref 70–99)
Glucose-Capillary: 160 mg/dL — ABNORMAL HIGH (ref 70–99)

## 2018-11-19 LAB — CBC
HCT: 37.3 % — ABNORMAL LOW (ref 39.0–52.0)
Hemoglobin: 11.1 g/dL — ABNORMAL LOW (ref 13.0–17.0)
MCH: 27.6 pg (ref 26.0–34.0)
MCHC: 29.8 g/dL — ABNORMAL LOW (ref 30.0–36.0)
MCV: 92.8 fL (ref 80.0–100.0)
Platelets: 122 10*3/uL — ABNORMAL LOW (ref 150–400)
RBC: 4.02 MIL/uL — ABNORMAL LOW (ref 4.22–5.81)
RDW: 16.3 % — ABNORMAL HIGH (ref 11.5–15.5)
WBC: 4.6 10*3/uL (ref 4.0–10.5)
nRBC: 0 % (ref 0.0–0.2)

## 2018-11-19 LAB — BASIC METABOLIC PANEL
Anion gap: 15 (ref 5–15)
BUN: 79 mg/dL — ABNORMAL HIGH (ref 8–23)
CO2: 22 mmol/L (ref 22–32)
Calcium: 8.5 mg/dL — ABNORMAL LOW (ref 8.9–10.3)
Chloride: 104 mmol/L (ref 98–111)
Creatinine, Ser: 3.63 mg/dL — ABNORMAL HIGH (ref 0.61–1.24)
GFR calc Af Amer: 18 mL/min — ABNORMAL LOW (ref >60)
GFR calc non Af Amer: 15 mL/min — ABNORMAL LOW (ref >60)
Glucose, Bld: 194 mg/dL — ABNORMAL HIGH (ref 70–99)
Potassium: 5.3 mmol/L — ABNORMAL HIGH (ref 3.5–5.1)
Sodium: 141 mmol/L (ref 135–145)

## 2018-11-19 LAB — MAGNESIUM: Magnesium: 1.8 mg/dL (ref 1.7–2.4)

## 2018-11-19 MED ORDER — SODIUM CHLORIDE 0.9 % IV SOLN
500.0000 mg | INTRAVENOUS | Status: AC
  Administered 2018-11-19 – 2018-11-23 (×5): 500 mg via INTRAVENOUS
  Filled 2018-11-19 (×6): qty 500

## 2018-11-19 MED ORDER — VITAL AF 1.2 CAL PO LIQD
1000.0000 mL | ORAL | Status: DC
  Administered 2018-11-19: 18:00:00 1000 mL

## 2018-11-19 MED ORDER — SODIUM CHLORIDE 0.9 % IV SOLN
2.0000 g | INTRAVENOUS | Status: AC
  Administered 2018-11-19 – 2018-11-25 (×7): 2 g via INTRAVENOUS
  Filled 2018-11-19 (×7): qty 20

## 2018-11-19 MED ORDER — FUROSEMIDE 10 MG/ML IJ SOLN
80.0000 mg | Freq: Two times a day (BID) | INTRAMUSCULAR | Status: AC
  Administered 2018-11-19: 80 mg via INTRAVENOUS
  Filled 2018-11-19: qty 8

## 2018-11-19 NOTE — Progress Notes (Signed)
Initial Nutrition Assessment   RD working remotely.  DOCUMENTATION CODES:   Obesity unspecified  INTERVENTION:  If pt remains intubated >/= 24-48 hours,  Recommend TF with Vital High Protein at goal rate of 41 ml/h (984 ml per day) and Prostat 60 ml TID to provide 1584 kcals, 176 gm protein, 827 ml free water daily.  NUTRITION DIAGNOSIS:   Inadequate oral intake related to inability to eat as evidenced by NPO status.  GOAL:   Provide needs based on ASPEN/SCCM guidelines  MONITOR:   Vent status, Labs, Skin, Weight trends, I & O's  REASON FOR ASSESSMENT:   Ventilator    ASSESSMENT:   78 year old man PMH of COPD, CKD3, CHF, non-small cell lung cancer in remission presents with increased shortness of breath and generalized weakness.  Found to have hypercarbic respiratory failure. Transfer from [place] negative.   Patient is currently intubated on ventilator support MV: 11.4 L/min Temp (24hrs), Avg:98.8 F (37.1 C), Min:98.3 F (36.8 C), Max:99.4 F (37.4 C)  Propofol: none   Per MD note, possible recurrence of metastatic disease. If unable to extubate, recommend initiation of enteral nutrition.    Unable to complete Nutrition-Focused physical exam at this time.   Labs and medications reviewed. Potassium elevated at 5.3.  Diet Order:   Diet Order            Diet NPO time specified  Diet effective now              EDUCATION NEEDS:   Not appropriate for education at this time  Skin:  Skin Assessment: Skin Integrity Issues: Skin Integrity Issues:: Stage II Stage II: coccyx  Last BM:  Unknown  Height:   Ht Readings from Last 1 Encounters:  11/21/2018 6\' 2"  (1.88 m)    Weight:   Wt Readings from Last 1 Encounters:  11/19/18 115 kg    Ideal Body Weight:  86.36 kg  BMI:  Body mass index is 32.55 kg/m.  Estimated Nutritional Needs:   Kcal:  1499-6924  Protein:  >/= 173 grams  Fluid:  >/= 2L/day    Corrin Parker, MS, RD,  LDN Pager # [phone] After hours/ weekend pager # [phone]

## 2018-11-19 NOTE — Progress Notes (Addendum)
NAME:  Edward Hahn, MRN:  [id number], DOB:  18-Feb-1941, LOS: 1 ADMISSION DATE:  11/03/2018, CONSULTATION DATE:  11/03/2018 REFERRING MD:  ED [facility], CHIEF COMPLAINT: Respiratory failure  Brief History   Patient unable to communicate, history from records from [facility].  78 year old man presents with increased shortness of breath and generalized weakness.  Found to have hypercarbic respiratory failure.  Intubated after failed trial of BiPAP.  PCO2 was up to 99 today. 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 Coronary artery disease and known congestive heart failure and recent admission for non-ST elevation MI. Significant Hospital Events   Transferred to [facility] 5/20   Consults:  PCCM  Procedures:  CTc hest 5/20 >> right hilar lymphadenopathy, completely collapsed/consolidated right lung  bronchoscopy 5/21 inspissated mucus suctioned out of right lower lobe, brushings obtained from right upper lobe  Significant Diagnostic Tests:  CXR 5/20 - Right lung opacification. ETT in position. (personally reviewed) - unchanged according to reports from [place]:  BAl 5/21 >>  Antimicrobials:  none   Interim history/subjective:  Remains critically ill, intubated Sedated on Precedex/fentanyl Afebrile Intermittent agitation  Objective   Blood pressure (!) 141/72, pulse 73, temperature 99 F (37.2 C), resp. rate 18, height _0  (1.88 m), weight 115 kg, SpO2 100 %.    Vent Mode: PRVC FiO2 (%):  [50 %-70 %] 50 % Set Rate:  [18 bmp] 18 bmp Vt Set:  [510 mL-650 mL] 650 mL PEEP:  [5 cmH20] 5 cmH20 Plateau Pressure:  [25 cmH20-30 cmH20] 25 cmH20    Intake/Output Summary (Last 24 hours) at 11/19/2018 1345 Last data filed at 11/19/2018 0900 Gross per 24 hour  Intake 844.83 ml  Output 4600 ml  Net -3755.17 ml   Filed Weights   11/16/2018 1500 11/19/18 0432  Weight: 116.9 kg 115 kg   Vent Mode: PRVC FiO2 (%):  [50 %-70 %] 50 % Set Rate:  [18 bmp] 18 bmp Vt Set:  [510 mL-650 mL] 650 mL PEEP:  [5 cmH20] 5 cmH20 Plateau Pressure:  [25 cmH20-30 cmH20] 25 cmH20  Examination: General: Chronically ill appearing man HENT: Endotracheal tube in place, OG tube in place.  Mild pallor, no icterus Lungs: Decreased breath sounds on right with bronchial breathing, clear on left Cardiovascular: JVP is elevated.  Heart sounds are distant but unremarkable.  Extremities are warm. Abdomen: Distended but nontender. Extremities: Moderate lower extremity edema.  Pressure ulceration on dorsum of toes left foot Neuro: Does not follow commands.  Demented agitation, RA SS -2 GU: Foley catheter in place with minimal urine output.     Resolved Hospital Problem list   Not applicable  Assessment & Plan:   Acute respiratory failure with hypoxia/hypercarbia due to right atelectasis -We will treat as community-acquired pneumonia -Bronchoscopy performed 5/21 -Start spontaneous breathing trials -Ceftriaxone/azithromycin while awaiting cultures   Acute diastolic heart failure -Echocardiogram shows normal EF, decreased RV SF, small pericardial effusion -Continue Lasix 80 every 12 for negative balance  History of non-small cell lung cancer Possible recurrence of metastatic disease - CT scan of chest -post bronchoscopy   CKD -3 ? AKI -avoid nephrotoxins/hypotension  Protein calorie malnutrition-start tube feeds  Best practice:  Diet: Low, initiate tube feeds. Pain/Anxiety/Delirium  protocol (if indicated): Precedex, Fentanyl infusion with as needed Versed.   RASS -2 to -1 VAP protocol (if indicated): Bundle in place DVT prophylaxis: Unfractionated  heparin GI prophylaxis: Famotidine Glucose control: Phase 1 glycemic protocol Mobility: Bedrest Code Status: Full code Family Communication: Updated son 5/21 Disposition: ICU  The patient is critically ill with multiple organ systems failure and requires high complexity decision making for assessment and support, frequent evaluation and titration of therapies, application of advanced monitoring technologies and extensive interpretation of multiple databases. Critical Care Time devoted to patient care services described in this note independent of APP/resident  time is 35 minutes.   Kara Mead MD. [facility]. [facility] Pager [phone] If no response call 319 [phone]   11/19/2018

## 2018-11-19 NOTE — Progress Notes (Signed)
Spoke w/ pts son to provide updates.

## 2018-11-19 NOTE — Procedures (Signed)
Bedside Bronchoscopy Procedure Note Edward Hahn [id number] [birth year]  Procedure: Bronchoscopy Indications: Obtain specimens for culture and/or other diagnostic studies and Remove secretions  Procedure Details: ET Tube Size:7.5 ET Tube secured at lip (cm):26 Bite block in place: No In preparation for procedure, Patient hyper-oxygenated with 100 % FiO2 and Saline given via ETT (60 ml) Airway entered and the following bronchi were examined: RUL, RML, RLL, LUL, LLL and Bronchi.   Bronchoscope removed.  , Patient placed back on 100% FiO2 at conclusion of procedure.    Evaluation BP (!) 141/72   Pulse 73   Temp 99 F (37.2 C)   Resp 18   Ht 6\' 2"  (1.88 m)   Wt 115 kg   SpO2 100%   BMI 32.55 kg/m  Breath Sounds:Diminished and Rhonch O2 sats: stable throughout Patient's Current Condition: stable Specimens:  Sent  Complications: No apparent complications Patient did tolerate procedure well.   [name] 11/19/2018, 12:44 PM

## 2018-11-19 NOTE — Procedures (Signed)
Bronchoscopy Procedure Note Edward Hahn [id number] April 24, 1941  Procedure: Bronchoscopy Indications: Diagnostic evaluation of the airways, Obtain specimens for culture and/or other diagnostic studies and Remove secretions  Procedure Details Consent: Risks of procedure as well as the alternatives and risks of each were explained to the (patient/caregiver).  Consent for procedure obtained. Time Out: Verified patient identification, verified procedure, site/side was marked, verified correct patient position, special equipment/implants available, medications/allergies/relevent history reviewed, required imaging and test results available.  Performed  In preparation for procedure, patient was given 100% FiO2 and bronchoscope lubricated. Sedation: fent gtt + precedx gtt + versed 2 mg IV  Airway entered and the following bronchi were examined: Bronchi.   Procedures performed: BAL, Brushings performed Thick inspissated mucus seen in right mainstem bronchus.  Took multiple passes in multiple lavages to suction this out in pieces.  Mucous plugs suctioned out.  Bronchi could be better visualized now.  Right upper lobe bronchus appeared to be distally blocked?  Old scarring brushings obtained from this area.  Right lower lobe bronchi appeared open once mucus suctioned Bronchoscope removed.  , Patient placed back on 100% FiO2 at conclusion of procedure.    Evaluation Hemodynamic Status: Transient hypotension treated with fluid; O2 sats: stable throughout Patient's Current Condition: stable Specimens:  Sent purulent fluid Complications: No apparent complications Patient did tolerate procedure well.   Edward Hahn Edward Hahn 11/19/2018

## 2018-11-20 ENCOUNTER — Inpatient Hospital Stay (HOSPITAL_COMMUNITY): Payer: Medicare Other

## 2018-11-20 LAB — BASIC METABOLIC PANEL
Anion gap: 14 (ref 5–15)
BUN: 77 mg/dL — ABNORMAL HIGH (ref 8–23)
CO2: 25 mmol/L (ref 22–32)
Calcium: 8.4 mg/dL — ABNORMAL LOW (ref 8.9–10.3)
Chloride: 104 mmol/L (ref 98–111)
Creatinine, Ser: 3.17 mg/dL — ABNORMAL HIGH (ref 0.61–1.24)
GFR calc Af Amer: 21 mL/min — ABNORMAL LOW (ref >60)
GFR calc non Af Amer: 18 mL/min — ABNORMAL LOW (ref >60)
Glucose, Bld: 218 mg/dL — ABNORMAL HIGH (ref 70–99)
Potassium: 4.6 mmol/L (ref 3.5–5.1)
Sodium: 143 mmol/L (ref 135–145)

## 2018-11-20 LAB — GLUCOSE, CAPILLARY
Glucose-Capillary: 166 mg/dL — ABNORMAL HIGH (ref 70–99)
Glucose-Capillary: 176 mg/dL — ABNORMAL HIGH (ref 70–99)
Glucose-Capillary: 240 mg/dL — ABNORMAL HIGH (ref 70–99)
Glucose-Capillary: 250 mg/dL — ABNORMAL HIGH (ref 70–99)
Glucose-Capillary: 228 mg/dL — ABNORMAL HIGH (ref 70–99)

## 2018-11-20 LAB — LEGIONELLA PNEUMOPHILA SEROGP 1 UR AG: L. pneumophila Serogp 1 Ur Ag: NEGATIVE

## 2018-11-20 MED ORDER — VITAL AF 1.2 CAL PO LIQD
1000.0000 mL | ORAL | Status: DC
  Administered 2018-11-20 – 2018-11-23 (×4): 1000 mL

## 2018-11-20 MED ORDER — SODIUM CHLORIDE 3 % IN NEBU
4.0000 mL | INHALATION_SOLUTION | Freq: Two times a day (BID) | RESPIRATORY_TRACT | Status: AC
  Administered 2018-11-20 – 2018-11-22 (×6): 4 mL via RESPIRATORY_TRACT
  Filled 2018-11-20 (×6): qty 4

## 2018-11-20 MED ORDER — SODIUM BICARBONATE 650 MG PO TABS
650.0000 mg | ORAL_TABLET | Freq: Three times a day (TID) | ORAL | Status: DC
  Administered 2018-11-20 – 2018-11-22 (×9): 650 mg
  Filled 2018-11-20 (×9): qty 1

## 2018-11-20 MED ORDER — INSULIN ASPART 100 UNIT/ML ~~LOC~~ SOLN
0.0000 [IU] | SUBCUTANEOUS | Status: DC
  Administered 2018-11-20: 5 [IU] via SUBCUTANEOUS
  Administered 2018-11-20: 3 [IU] via SUBCUTANEOUS
  Administered 2018-11-20: 5 [IU] via SUBCUTANEOUS
  Administered 2018-11-20: 3 [IU] via SUBCUTANEOUS
  Administered 2018-11-20: 5 [IU] via SUBCUTANEOUS
  Administered 2018-11-20: 3 [IU] via SUBCUTANEOUS
  Administered 2018-11-21: 8 [IU] via SUBCUTANEOUS
  Administered 2018-11-21: 15 [IU] via SUBCUTANEOUS
  Administered 2018-11-21: 8 [IU] via SUBCUTANEOUS
  Administered 2018-11-21 (×2): 11 [IU] via SUBCUTANEOUS
  Administered 2018-11-21: 8 [IU] via SUBCUTANEOUS
  Administered 2018-11-21: 5 [IU] via SUBCUTANEOUS
  Administered 2018-11-22: 21:00:00 8 [IU] via SUBCUTANEOUS
  Administered 2018-11-22 (×2): 5 [IU] via SUBCUTANEOUS
  Administered 2018-11-22: 17:00:00 8 [IU] via SUBCUTANEOUS
  Administered 2018-11-22: 5 [IU] via SUBCUTANEOUS
  Administered 2018-11-23: 17:00:00 3 [IU] via SUBCUTANEOUS
  Administered 2018-11-23 (×2): 5 [IU] via SUBCUTANEOUS
  Administered 2018-11-23: 8 [IU] via SUBCUTANEOUS
  Administered 2018-11-23: 08:00:00 5 [IU] via SUBCUTANEOUS
  Administered 2018-11-23 – 2018-11-24 (×2): 3 [IU] via SUBCUTANEOUS
  Administered 2018-11-24: 12:00:00 5 [IU] via SUBCUTANEOUS
  Administered 2018-11-24: 05:00:00 3 [IU] via SUBCUTANEOUS
  Administered 2018-11-24 (×4): 5 [IU] via SUBCUTANEOUS
  Administered 2018-11-25: 2 [IU] via SUBCUTANEOUS
  Administered 2018-11-25: 04:00:00 5 [IU] via SUBCUTANEOUS
  Administered 2018-11-25: 13:00:00 3 [IU] via SUBCUTANEOUS
  Administered 2018-11-25: 20:00:00 2 [IU] via SUBCUTANEOUS
  Administered 2018-11-25: 11:00:00 3 [IU] via SUBCUTANEOUS
  Administered 2018-11-26: 04:00:00 2 [IU] via SUBCUTANEOUS

## 2018-11-20 MED ORDER — PRO-STAT SUGAR FREE PO LIQD
30.0000 mL | Freq: Three times a day (TID) | ORAL | Status: DC
  Administered 2018-11-20 – 2018-11-24 (×15): 30 mL
  Filled 2018-11-20 (×15): qty 30

## 2018-11-20 MED ORDER — INSULIN ASPART 100 UNIT/ML ~~LOC~~ SOLN
3.0000 [IU] | SUBCUTANEOUS | Status: DC
  Administered 2018-11-20 – 2018-11-21 (×7): 3 [IU] via SUBCUTANEOUS

## 2018-11-20 MED ORDER — FUROSEMIDE 10 MG/ML IJ SOLN: 80.0000 mg | Freq: Two times a day (BID) | INTRAMUSCULAR | Status: DC

## 2018-11-20 MED ORDER — FUROSEMIDE 10 MG/ML IJ SOLN
80.0000 mg | Freq: Once | INTRAMUSCULAR | Status: AC
  Administered 2018-11-20: 80 mg via INTRAVENOUS
  Filled 2018-11-20: qty 8

## 2018-11-20 MED ORDER — ZINC OXIDE 40 % EX OINT
TOPICAL_OINTMENT | Freq: Two times a day (BID) | CUTANEOUS | Status: DC
  Administered 2018-11-20 – 2018-11-21 (×3): 1 via TOPICAL
  Administered 2018-11-21 – 2018-11-22 (×2): via TOPICAL
  Administered 2018-11-22: 1 via TOPICAL
  Administered 2018-11-23 – 2018-11-25 (×6): via TOPICAL
  Filled 2018-11-20: qty 57

## 2018-11-20 NOTE — Progress Notes (Signed)
Nutrition Follow-up RD working remotely.  DOCUMENTATION CODES:   Not applicable  INTERVENTION:    Increase Vital AF 1.2 to goal rate of 65 ml/h (1560 ml per day)   Pro-stat 30 ml TID   Provides 2172 kcal, 162 gm protein, 1265 ml free water daily  NUTRITION DIAGNOSIS:   Inadequate oral intake related to inability to eat as evidenced by NPO status.  Ongoing  GOAL:   Patient will meet greater than or equal to 90% of their needs  Progressing  MONITOR:   Vent status, TF tolerance, Labs, Skin, I & O's, Weight trends  REASON FOR ASSESSMENT:   Consult Enteral/tube feeding initiation and management  ASSESSMENT:   78 year old man PMH of COPD, CKD3, CHF, non-small cell lung cancer in remission presents with increased shortness of breath and generalized weakness.  Found to have hypercarbic respiratory failure.  Received MD Consult for TF initiation and management. OGT in place. Currently receiving Vital AF 1.2 at 20 ml/h, tolerating well since initiation yesterday afternoon.    Patient remains intubated on ventilator support MV: 11.5 L/min Temp (24hrs), Avg:98.9 F (37.2 C), Min:97.7 F (36.5 C), Max:99.5 F (37.5 C)   Labs reviewed. BUN 77 (H), creatinine 3.17 (H), both trending down. CBG's: 166-176  Medications reviewed and include Novolog and Precedex.  I/O net negative 6.2 L Weight trending down. Per RN documentation this morning, patient has mild generalized edema, mild pitting edema to BLE, and moderate pitting edema to BUE.  Suspect actual weight is lower than today's recorded weight and that patient's BMI is <30, not obese. Re-estimated needs reflect suspected non-obese weight.  Unable to complete NFPE.  Diet Order:   Diet Order            Diet NPO time specified  Diet effective now              EDUCATION NEEDS:   Not appropriate for education at this time  Skin:   Stage II: coccyx Other: blisters (trauma injury) to 2nd and 3rd toes on R  foot  Last BM:  no BM documented  Height:   Ht Readings from Last 1 Encounters:  11/08/2018 6\' 2"  (1.88 m)    Weight:   Wt Readings from Last 1 Encounters:  11/20/18 106.5 kg   11/19/18 115 kg    Ideal Body Weight:  86.36 kg  BMI:  Body mass index is 30.15 kg/m.  Estimated Nutritional Needs:   Kcal:  2190  Protein:  140-170 gm  Fluid:  >/= 2.2 L    Molli Barrows, RD, LDN, [place] Pager [phone] After Hours Pager [phone]

## 2018-11-20 NOTE — Progress Notes (Addendum)
78 year old with COPD and a history of lung cancer treated with chemoradiation in 2016, and presumed remission with chronic right upper lobe atelectasis/scarring on prior imaging.  Admitted 5/20 from [facility] with acute hypercarbic respiratory failure requiring mechanical ventilation Bronchoscopy performed on 5/21 and thick inspissated mucus removed from right middle and lower lobe bronchi with improved aeration noted on chest x-ray that I personally reviewed from 5/20 2 AM. CT chest from 5/21 shows complete atelectasis/consolidation of the right lung with right hilar lymphadenopathy but no other evidence of recurrent cancer  On exam-remains sedated on Precedex and fentanyl, RA SS -3, decreased breath sounds on right with transmitted bronchial breathing, good air entry on left, appears chronically ill, elderly, S1-S2 normal, soft and nontender abdomen, 1+ edema  Labs reviewed which shows improving creatinine to 3.1, normal electrolytes.  Impression/plan  Acute hypercarbic respiratory failure -underlying COPD with now complete right atelectasis improved post bronchoscopy and removal of secretions -Treating as community-acquired pneumonia with ceftriaxone and azithromycin -Pulmonary toilet with hypertonic saline -Add maintenance nebs Pulmicort and Brovana -Start spontaneous breathing trials  History of lung cancer-unclear if this indicates recurrence, bronchoscopy showed scarring in right upper lobe and brushings were taken, await cytology results   AKI -unclear baseline creatinine, he appears euvolemic , good negative balance achieved we will continue low-dose Lasix for equal balance if he tolerates  Updated son 5/21-DNR issued ,we will see how he progresses with spontaneous breathing trials now that right lung is better aerated and see if we can liberate him from the ventilator, if we do not make much progress over the next few days then we may have to reconsider goals of care.  He certainly  should not be reintubated  The patient is critically ill with multiple organ systems failure and requires high complexity decision making for assessment and support, frequent evaluation and titration of therapies, application of advanced monitoring technologies and extensive interpretation of multiple databases. Critical Care Time devoted to patient care services described in this note independent of APP/resident  time is 33 minutes.   [name] [place] S[place] MD

## 2018-11-20 NOTE — Progress Notes (Addendum)
eLink Physician-Brief Progress Note Patient Name: Edward Hahn DOB: August 31, 1940 MRN: [id number]   Date of Service  11/20/2018  HPI/Events of Note  Notified of hyperglycemia.  Pt is tolerating tube feeds.   eICU Interventions  Start on insulin sliding scale.     Intervention Category Intermediate Interventions: Hyperglycemia - evaluation and treatment  [name] 11/20/2018, 12:06 AM   5:32 AM  Notified by bedside RN that the pleurx catheter was left uncapped by previous shift.  Ok to replace cap now.

## 2018-11-20 NOTE — Consult Note (Signed)
[place] Nurse wound consult note Patient receiving care in [facility] 3M04. Reason for Consult: Left foot toes 2 & 3 Wound type: These areas are consistent with a trauma injury, perhaps from a shoe rubbing the toes, creating blisters and now the blisters have popped.  Underneath the loose tissue of the 2nd toe, the area is pink without odor, purulence, drainage, or odor.  The 3rd toe darkened tissue is adherent to the area.  With time and gentle cleansing this area should dry up and flake off. Staff also asked me to evaluate his coccyx area.  It is impacted by MASD at this time.  There was a sacral foam dressing on, which I have removed and will enter an order not to use this going forward.  And, I will enter an order for 40% Desitin ointment to the area. Monitor the wound area(s) for worsening of condition such as: Signs/symptoms of infection,  Increase in size,  Development of or worsening of odor, Development of pain, or increased pain at the affected locations.  Notify the medical team if any of these develop.  Thank you for the consult.  Discussed plan of care with the bedside nurse, Shea Stakes.  [place] nurse will not follow at this time.  Please re-consult the [place] team if needed.  Val Riles, RN, MSN, CWOCN, CNS-BC, pager [phone]

## 2018-11-20 NOTE — Progress Notes (Signed)
NAME:  Edward Hahn, MRN:  [id number], DOB:  1941/01/22, LOS: 2 ADMISSION DATE:  11/14/2018, CONSULTATION DATE:  11/28/2018 REFERRING MD:  ED [facility], CHIEF COMPLAINT: Respiratory failure  Brief History   Patient unable to communicate, history from records from [facility].  78 year old man presents with increased shortness of breath and generalized weakness.  Found to have hypercarbic respiratory failure.  Intubated after failed trial of BiPAP.  PCO2 was up to 99 today. 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 Coronary artery disease and known congestive heart failure and recent admission for non-ST elevation MI. Significant Hospital Events   Transferred to [facility] 5/20 after presenting to outside hospital with right lung opacification, and respiratory failure 5/21: Bronchoscopy performed at bedside for diagnostic evaluation of the airway.  "Thick inspissated mucus seen in right mainstem bronchus.  Took multiple passes in multiple lavages to suction this out in pieces.  Mucous plugs suctioned out.  Bronchi could be better visualized now.  Right upper lobe bronchus appeared to be distally blocked?  Old scarring brushings obtained from this area.  Right lower lobe bronchi appeared open once mucus suctioned".  Remains sedated for intermittent agitation.  Started ceftriaxone and azithromycin for possible community acquired pneumonia, diuresis initiated for possible element of diastolic dysfunction.  Also concerned about possible recurrence of non-small cell lung cancer 5/22: Wound ostomy nurse consulted for traumatic injury to left foot toes #2 and 3 felt secondary to possiby pressure from shoes.   Also had moisture injury on coccyx   Consults:  PCCM  Procedures:  CTc hest 5/20 >> right hilar lymphadenopathy, completely collapsed/consolidated right lung bronchoscopy 5/21:, brushings obtained from right upper lobe  Significant Diagnostic Tests:  CXR 5/20 - Right lung opacification. ETT in position. (personally reviewed) - unchanged according to reports from [facility]. Echocardiogram 5/20: Ejection fraction 50 to 55% with cavity mildly dilated.  Doppler parameters consistent with impaired relaxation, there was mildly reduced right ventricular systolic function and the cavity was mildly enlarged there was small circumferential pericardial effusion Micro Data:  BAl 5/21 >> Blood cultures 5/20 COVID-19 5/20: Negative Antimicrobials:  azithromycin 5/21>>> Ceftriaxone 5/21>>>  Interim history/subjective:  No significant fever, hemodynamically stable, attempting to wean fentanyl down.  Blood pressure in the low 262M to 35D systolic  Objective   Blood pressure 105/62, pulse 80, temperature 99.4 F (37.4 C), temperature source Axillary, resp. rate 18, height _0  (1.88 m), weight 106.5 kg, SpO2 98 %.    Vent Mode: PRVC FiO2 (%):  [50 %] 50 % Set Rate:  [18 bmp] 18 bmp Vt Set:  [650 mL] 650 mL PEEP:  [5 cmH20] 5 cmH20 Plateau Pressure:  [18 cmH20-27 cmH20] 18 cmH20   Intake/Output Summary (Last 24 hours) at 11/20/2018 0753 Last data filed at 11/20/2018 0700 Gross per 24 hour  Intake 1844.69 ml  Output 4775 ml  Net -2930.31 ml   Filed Weights   11/19/2018 1500 11/19/18 0432 11/20/18 0418  Weight: 116.9 kg 115 kg 106.5 kg   Vent Mode: PRVC FiO2 (%):  [50 %] 50 % Set Rate:  [18 bmp] 18 bmp Vt Set:  [650 mL] 650 mL PEEP:  [5 cmH20] 5 cmH20 Plateau Pressure:  [18 cmH20-27 cmH20] 18 cmH20  Examination:  General: This is a disheveled 78 year old white male is currently mechanically ventilated and sedated HEENT normocephalic atraumatic orally intubated no JVD sclera  nonicteric  Pulmonary: Diffuse scattered rhonchi diminished particularly on the right mechanically assisted breathing Cardiac: Regular rate and rhythm without audible murmur rub or gallop Abdomen: Soft not tender no organomegaly tolerating tube feeds presently GU: Clear yellow Extremities: Warm and dry no significant edema scattered areas of ecchymosis, blisters noted on left foot Neuro: Awakens to gentle physical stimulus.  Moves all extremities.  Attempts to communicate but confused    Resolved Hospital Problem list   Not applicable  Assessment & Plan:   Acute respiratory failure with hypoxia/hypercarbia due to right atelectasis -, Status post bronchoscopy 5/21: Specimens were sent for culture and cytology. Portable chest x-ray reviewed for 5/22: This demonstrates the following endotracheal tube was in satisfactory position.  Currently at the apex of the carina.  There continues to be significant right sided collapse and airspace disease.  However comparing exam from 5/21 this is improved.  There is mild left basilar atelectasis this is essentially unchanged from prior exam Plan Continuing full ventilator support with daily assessment for spontaneous breathing trial Day #2 ceftriaxone and a azithromycin we will narrow as cultures dictate otherwise complete 5 days of a Zithromax and 7 days of ceftriaxone Follow-up pending sputum culture VAP bundle Await cytology Add hypertonic saline nebulized twice daily Continue Lasix as long as BUN creatinine And blood pressure will support   Acute diastolic heart failure -Echocardiogram shows normal EF, decreased RV SF, small pericardial effusion -Tolerating diuresis; currently 6.2 L negative Plan We will continue IV diuresis at same dosing; 80 mg twice daily  Repeat chemistry in a.m. He was on Tenormin at home, we can consider low-dose Coreg twice daily  Mild acute metabolic encephalopathy in setting of hypoxia plus/minus  infection Plan PAD protocol RAS goal 0 We will continue to wean fentanyl drip with goal to change this to PRN Continue Precedex  History of non-small cell lung cancer Possible recurrence of metastatic disease - CT scan of chest -post bronchoscopy Plan Follow-up cytology from bronchoscopy on 5/21  CKD -3; ? 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 Pain/Anxiety/Delirium protocol (if indicated): Precedex, Fentanyl infusion with as needed Versed.   RASS -2 to -1 VAP protocol (if indicated): Bundle in place DVT prophylaxis: Unfractionated heparin GI prophylaxis: Famotidine Glucose control: Phase 1 glycemic protocol Mobility: Bedrest Code Status: Full code Family Communication: Updated son 5/21 Disposition: ICU: Frail 32 year old disheveled male admitted with right sided lung collapse and associated respiratory failure.  Differential diagnosis includes mucous plugging in setting of CAP versus recurrent lung cancer, with postobstructive pneumonia.  We are working on weaning oxygen, titrating sedation, and currently awaiting both culture and cytology from bronchoscopy completed on the 21st.  My critical care x33 minutes Erick Colace ACNP-BC [place] Pager # [phone] OR # [phone] if no answer  11/20/2018

## 2018-11-21 ENCOUNTER — Encounter (HOSPITAL_COMMUNITY): Payer: Self-pay | Admitting: Cardiology

## 2018-11-21 ENCOUNTER — Inpatient Hospital Stay (HOSPITAL_COMMUNITY): Payer: Medicare Other

## 2018-11-21 DIAGNOSIS — I480 Paroxysmal atrial fibrillation: Secondary | ICD-10-CM

## 2018-11-21 LAB — BASIC METABOLIC PANEL
Anion gap: 15 (ref 5–15)
Anion gap: 14 (ref 5–15)
BUN: 84 mg/dL — ABNORMAL HIGH (ref 8–23)
BUN: 89 mg/dL — ABNORMAL HIGH (ref 8–23)
CO2: 26 mmol/L (ref 22–32)
CO2: 27 mmol/L (ref 22–32)
Calcium: 8.3 mg/dL — ABNORMAL LOW (ref 8.9–10.3)
Calcium: 8.2 mg/dL — ABNORMAL LOW (ref 8.9–10.3)
Chloride: 105 mmol/L (ref 98–111)
Chloride: 105 mmol/L (ref 98–111)
Creatinine, Ser: 3.09 mg/dL — ABNORMAL HIGH (ref 0.61–1.24)
Creatinine, Ser: 2.91 mg/dL — ABNORMAL HIGH (ref 0.61–1.24)
GFR calc Af Amer: 21 mL/min — ABNORMAL LOW (ref >60)
GFR calc Af Amer: 23 mL/min — ABNORMAL LOW (ref >60)
GFR calc non Af Amer: 18 mL/min — ABNORMAL LOW (ref >60)
GFR calc non Af Amer: 20 mL/min — ABNORMAL LOW (ref >60)
Glucose, Bld: 331 mg/dL — ABNORMAL HIGH (ref 70–99)
Glucose, Bld: 398 mg/dL — ABNORMAL HIGH (ref 70–99)
Potassium: 3.7 mmol/L (ref 3.5–5.1)
Potassium: 3.6 mmol/L (ref 3.5–5.1)
Sodium: 146 mmol/L — ABNORMAL HIGH (ref 135–145)
Sodium: 146 mmol/L — ABNORMAL HIGH (ref 135–145)

## 2018-11-21 LAB — PHOSPHORUS: Phosphorus: 4.2 mg/dL (ref 2.5–4.6)

## 2018-11-21 LAB — GLUCOSE, CAPILLARY
Glucose-Capillary: 261 mg/dL — ABNORMAL HIGH (ref 70–99)
Glucose-Capillary: 260 mg/dL — ABNORMAL HIGH (ref 70–99)
Glucose-Capillary: 323 mg/dL — ABNORMAL HIGH (ref 70–99)
Glucose-Capillary: 342 mg/dL — ABNORMAL HIGH (ref 70–99)
Glucose-Capillary: 356 mg/dL — ABNORMAL HIGH (ref 70–99)
Glucose-Capillary: 300 mg/dL — ABNORMAL HIGH (ref 70–99)
Glucose-Capillary: 249 mg/dL — ABNORMAL HIGH (ref 70–99)

## 2018-11-21 LAB — T4, FREE: Free T4: 0.83 ng/dL (ref 0.82–1.77)

## 2018-11-21 LAB — CULTURE, BAL-QUANTITATIVE W GRAM STAIN: Culture: 1000 — AB

## 2018-11-21 LAB — MAGNESIUM: Magnesium: 1.8 mg/dL (ref 1.7–2.4)

## 2018-11-21 LAB — TSH: TSH: 0.514 u[IU]/mL (ref 0.350–4.500)

## 2018-11-21 MED ORDER — SODIUM CHLORIDE 0.9 % IV BOLUS
500.0000 mL | Freq: Once | INTRAVENOUS | Status: AC
  Administered 2018-11-21: 500 mL via INTRAVENOUS

## 2018-11-21 MED ORDER — ARFORMOTEROL TARTRATE 15 MCG/2ML IN NEBU
15.0000 ug | INHALATION_SOLUTION | Freq: Two times a day (BID) | RESPIRATORY_TRACT | Status: DC
  Administered 2018-11-21: 15 ug via RESPIRATORY_TRACT
  Filled 2018-11-21: qty 2

## 2018-11-21 MED ORDER — FENTANYL CITRATE (PF) 100 MCG/2ML IJ SOLN
25.0000 ug | INTRAMUSCULAR | Status: DC | PRN
  Administered 2018-11-21: 25 ug via INTRAVENOUS
  Filled 2018-11-21 (×2): qty 2

## 2018-11-21 MED ORDER — DILTIAZEM LOAD VIA INFUSION
10.0000 mg | Freq: Once | INTRAVENOUS | Status: AC
  Administered 2018-11-21: 10 mg via INTRAVENOUS
  Filled 2018-11-21 (×3): qty 10

## 2018-11-21 MED ORDER — ACETYLCYSTEINE 10 % IN SOLN
2.0000 mL | Freq: Four times a day (QID) | RESPIRATORY_TRACT | Status: DC
  Filled 2018-11-21 (×6): qty 2

## 2018-11-21 MED ORDER — INSULIN GLARGINE 100 UNIT/ML ~~LOC~~ SOLN
20.0000 [IU] | Freq: Every day | SUBCUTANEOUS | Status: DC
  Administered 2018-11-21 – 2018-11-22 (×2): 20 [IU] via SUBCUTANEOUS
  Filled 2018-11-21 (×2): qty 0.2

## 2018-11-21 MED ORDER — DILTIAZEM HCL-DEXTROSE 100-5 MG/100ML-% IV SOLN (PREMIX)
5.0000 mg/h | INTRAVENOUS | Status: DC
  Administered 2018-11-21: 5 mg/h via INTRAVENOUS
  Filled 2018-11-21 (×2): qty 100

## 2018-11-21 MED ORDER — ACETYLCYSTEINE 20 % IN SOLN: 3.0000 mL | Freq: Four times a day (QID) | RESPIRATORY_TRACT | Status: DC

## 2018-11-21 MED ORDER — ACETYLCYSTEINE 20 % IN SOLN
3.0000 mL | Freq: Four times a day (QID) | RESPIRATORY_TRACT | Status: DC
  Administered 2018-11-22: 03:00:00 3 mL via RESPIRATORY_TRACT
  Administered 2018-11-22 (×2): 4 mL via RESPIRATORY_TRACT
  Administered 2018-11-22 – 2018-11-24 (×7): 3 mL via RESPIRATORY_TRACT
  Filled 2018-11-21 (×10): qty 4

## 2018-11-21 MED ORDER — INSULIN ASPART 100 UNIT/ML ~~LOC~~ SOLN
4.0000 [IU] | SUBCUTANEOUS | Status: DC
  Administered 2018-11-21 – 2018-11-22 (×9): 4 [IU] via SUBCUTANEOUS

## 2018-11-21 MED ORDER — DEXTROSE 5 % IV SOLN
60.0000 mg/h | INTRAVENOUS | Status: DC
  Filled 2018-11-21: qty 9

## 2018-11-21 MED ORDER — FUROSEMIDE 10 MG/ML IJ SOLN
40.0000 mg | Freq: Two times a day (BID) | INTRAMUSCULAR | Status: DC
  Administered 2018-11-21: 40 mg via INTRAVENOUS
  Filled 2018-11-21: qty 4

## 2018-11-21 MED ORDER — LEVALBUTEROL HCL 0.63 MG/3ML IN NEBU
0.6300 mg | INHALATION_SOLUTION | Freq: Four times a day (QID) | RESPIRATORY_TRACT | Status: DC
  Administered 2018-11-21 – 2018-11-24 (×12): 0.63 mg via RESPIRATORY_TRACT
  Filled 2018-11-21 (×13): qty 3

## 2018-11-21 MED ORDER — SENNOSIDES 8.8 MG/5ML PO SYRP
10.0000 mL | ORAL_SOLUTION | Freq: Two times a day (BID) | ORAL | Status: DC
  Administered 2018-11-21 – 2018-11-22 (×2): 10 mL via ORAL
  Filled 2018-11-21 (×2): qty 10

## 2018-11-21 MED ORDER — BISACODYL 10 MG RE SUPP: 10.0000 mg | Freq: Every day | RECTAL | Status: DC | PRN

## 2018-11-21 MED ORDER — INSULIN GLARGINE 100 UNIT/ML ~~LOC~~ SOLN
10.0000 [IU] | Freq: Every day | SUBCUTANEOUS | Status: DC
  Filled 2018-11-21: qty 0.1

## 2018-11-21 MED ORDER — SODIUM CHLORIDE 0.9 % IV SOLN
INTRAVENOUS | Status: DC | PRN
  Administered 2018-11-21 – 2018-11-22 (×2): 250 mL via INTRAVENOUS
  Administered 2018-11-23 – 2018-11-25 (×2): 500 mL via INTRAVENOUS

## 2018-11-21 MED ORDER — BUDESONIDE 0.5 MG/2ML IN SUSP
0.5000 mg | Freq: Every day | RESPIRATORY_TRACT | Status: DC
  Administered 2018-11-22 – 2018-11-26 (×5): 0.5 mg via RESPIRATORY_TRACT
  Filled 2018-11-21 (×6): qty 2

## 2018-11-21 MED ORDER — POTASSIUM CHLORIDE 20 MEQ/15ML (10%) PO SOLN
20.0000 meq | Freq: Once | ORAL | Status: AC
  Administered 2018-11-21: 20 meq
  Filled 2018-11-21: qty 15

## 2018-11-21 MED ORDER — AMIODARONE LOAD VIA INFUSION
150.0000 mg | Freq: Once | INTRAVENOUS | Status: DC
  Filled 2018-11-21: qty 83.34

## 2018-11-21 MED ORDER — DEXTROSE 5 % IV SOLN
30.0000 mg/h | INTRAVENOUS | Status: DC
  Administered 2018-11-21: 30 mg/h via INTRAVENOUS
  Filled 2018-11-21 (×3): qty 9

## 2018-11-21 MED ORDER — FUROSEMIDE 10 MG/ML IJ SOLN: 40.0000 mg | Freq: Every day | INTRAMUSCULAR | Status: DC

## 2018-11-21 MED ORDER — FENTANYL CITRATE (PF) 100 MCG/2ML IJ SOLN
25.0000 ug | INTRAMUSCULAR | Status: DC | PRN
  Administered 2018-11-22 – 2018-11-23 (×2): 25 ug via INTRAVENOUS
  Filled 2018-11-21: qty 2

## 2018-11-21 NOTE — Progress Notes (Addendum)
eLink Physician-Brief Progress Note Patient Name: Edward Hahn DOB: [birth year] MRN: [id number]   Date of Service  11/21/2018  HPI/Events of Note  Pharmacy asking to change Mucomyst nebs  To 20%, available q6 hr for 8 doses. Lung cancer.   eICU Interventions  Corrected and re ordered 20% strength.      Intervention Category Minor Interventions: Routine modifications to care plan (e.g. PRN medications for pain, fever)  Elmer Sow 11/21/2018, 8:59 PM   00:01  Asking for more need for pain meds.  Lung cancer/chf. Increase Fentanyl q1 hr.

## 2018-11-21 NOTE — Consult Note (Signed)
CARDIOLOGY CONSULT NOTE  Patient ID: Edward Hahn MRN: [id number] DOB/AGE: 1941/04/05 78 y.o.  Admit date: 11/06/2018 Referring Physician: PCCN Primary Physician:  Neale Burly, MD Reason for Consultation: Atrial fibrillation  HPI:   78 y.o. Caucasian male  with history of non-small cell lung carcinoma, status post chemotherapy, uncontrolled type 2 diabetes mellitus, CKD stage III, now admitted with acute hypoxic and hypercarbic respiratory failure, and acute metabolic encephalopathy.  Cardiology consulted for management of atrial fibrillation.  Work-up so far has been concerning for recurrent metastatic non-small cell lung carcinoma, as per PCCM note.  CT chest today shows collapse/consolidation with possible pleural effusion, as well as small pericardial effusion.  Echocardiogram shows normal LVEF, but shows moderately reduced RV systolic function. Troponin mildly elevated at 0.19 ng/mL. BNP elevated at 1065 ng/mL.  He has acute kidney injury with Cr>3. COVID test was negative.   He is currently intubated, sedated. While on diltiazem drip, he was noted to be hypotensive, but not on pressors. He seems to have converted to normal sinus rhythm. Currently, he is not on diltiazem drip.   Past Medical History:  Diagnosis Date  . CKD (chronic kidney disease)   . Non-small cell lung cancer ([place])      No family history on file.  Unable to obtain in this critically ill patient.   Social History: Social History   Socioeconomic History  . Marital status: Widowed    Spouse name: Not on file  . Number of children: Not on file  . Years of education: Not on file  . Highest education level: Not on file  Occupational History  . Not on file  Social Needs  . Financial resource strain: Not on file  . Food insecurity:    Worry: Not on file    Inability: Not on file  . Transportation needs:    Medical: Not on file    Non-medical: Not on file  Tobacco Use  . Smoking status: Not on file   Substance and Sexual Activity  . Alcohol use: Not on file  . Drug use: Not on file  . Sexual activity: Not on file  Lifestyle  . Physical activity:    Days per week: Not on file    Minutes per session: Not on file  . Stress: Not on file  Relationships  . Social connections:    Talks on phone: Not on file    Gets together: Not on file    Attends religious service: Not on file    Active member of club or organization: Not on file    Attends meetings of clubs or organizations: Not on file    Relationship status: Not on file  . Intimate partner violence:    Fear of current or ex partner: Not on file    Emotionally abused: Not on file    Physically abused: Not on file    Forced sexual activity: Not on file  Other Topics Concern  . Not on file  Social History Narrative  . Not on file     Medications Prior to Admission  Medication Sig Dispense Refill Last Dose  . albuterol (VENTOLIN HFA) 108 (90 Base) MCG/ACT inhaler Inhale 2 puffs into the lungs every 6 (six) hours as needed for wheezing or shortness of breath.   unk  . atenolol (TENORMIN) 50 MG tablet Take 50 mg by mouth daily.   11/15/2018  . Doxylamine Succinate, Sleep, (SLEEP AID PO) Take 1 tablet by mouth daily as needed (sleep).  11/14/2018  . furosemide (LASIX) 40 MG tablet Take 20-40 mg by mouth See admin instructions. Taking 1 tablet ('40mg'$ ) and 1/2 tablet ('20mg'$ ) in the afternoon.   11/15/2018  . gemfibrozil (LOPID) 600 MG tablet Take 600 mg by mouth 2 (two) times daily before a meal.   11/15/2018  . HUMALOG KWIKPEN 100 UNIT/ML KwikPen Inject 20 Units into the skin 3 (three) times daily before meals.   11/15/2018  . ipratropium-albuterol (DUONEB) 0.5-2.5 (3) MG/3ML SOLN Take 3 mLs by nebulization every 6 (six) hours as needed (wheezing, shortness of breath).   unk  . LANTUS SOLOSTAR 100 UNIT/ML Solostar Pen Inject 40 Units into the skin 2 (two) times daily. Morning and at bedtime   11/15/2018  . linagliptin (TRADJENTA) 5 MG TABS  tablet Take 5 mg by mouth daily.   11/15/2018  . lovastatin (MEVACOR) 20 MG tablet Take 20 mg by mouth daily.   11/15/2018  . naproxen sodium (ALEVE) 220 MG tablet Take 220 mg by mouth 2 (two) times daily as needed (arthritis pain).   Past Week at Unknown time  . sodium bicarbonate 650 MG tablet Take 650 mg by mouth 3 (three) times daily.   11/15/2018    Review of Systems  Unable to perform ROS: intubated      Physical Exam: Physical Exam  Constitutional: He appears well-developed and well-nourished. No distress.  HENT:  Head: Normocephalic and atraumatic.  Eyes: Pupils are equal, round, and reactive to light. Conjunctivae are normal.  Neck: No JVD present.  Cardiovascular: An irregularly irregular rhythm present. Tachycardia present.  Pulmonary/Chest: Effort normal and breath sounds normal. He has no wheezes. He has no rales.  Abdominal: Soft. Bowel sounds are normal. There is no rebound.  Musculoskeletal:        General: No edema.  Lymphadenopathy:    He has no cervical adenopathy.  Neurological: No cranial nerve deficit.  Intubated, sedated  Skin: Skin is warm and dry.  Psychiatric: He has a normal mood and affect.  Nursing note and vitals reviewed.    Labs:   Lab Results  Component Value Date   WBC 4.6 11/19/2018   HGB 11.1 (L) 11/19/2018   HCT 37.3 (L) 11/19/2018   MCV 92.8 11/19/2018   PLT 122 (L) 11/19/2018    Recent Labs  Lab 11/20/2018 1606  11/21/18 0341  NA 140   < > 146*  K 5.7*   < > 3.7  CL 103   < > 105  CO2 26   < > 26  BUN 75*   < > 84*  CREATININE 3.89*   < > 3.09*  CALCIUM 8.5*   < > 8.3*  PROT 6.7  --   --   BILITOT 0.6  --   --   ALKPHOS 128*  --   --   ALT 11  --   --   AST 8*  --   --   GLUCOSE 151*   < > 331*   < > = values in this interval not displayed.    Lipid Panel  No results found for: CHOL, TRIG, HDL, CHOLHDL, VLDL, LDLCALC  BNP (last 3 results) Recent Labs    11/23/2018 1606  BNP 1,065.3*    HEMOGLOBIN A1C No results  found for: HGBA1C, MPG  Cardiac Panel (last 3 results) Recent Labs    11/01/2018 1606 11/11/2018 2331 11/19/18 0511  TROPONINI 0.09* 0.14* 0.19*    Lab Results  Component Value Date   TROPONINI 0.19 (  HH) 11/19/2018     TSH No results for input(s): TSH in the last 8760 hours.    Radiology: Dg Chest Port 1 View  Result Date: 11/21/2018 CLINICAL DATA:  78 year old male with history of pneumonia. EXAM: PORTABLE CHEST 1 VIEW COMPARISON:  Chest x-ray 11/20/2018. FINDINGS: An endotracheal tube is in place with tip 1.8 cm above the carina. A nasogastric tube is seen extending into the stomach, however, the tip of the nasogastric tube extends below the lower margin of the image. Right internal jugular single-lumen porta cath with tip terminating in the right atrium. Chronic right-sided consolidation and volume loss with upward retraction of right hilar structures and slight shift of cardiomediastinal structures from left to right, similar to the recent prior examination. Patchy airspace consolidation also noted in the left lower lobe. No evidence of pulmonary edema. Moderate bilateral pleural effusions. Heart size appears normal. Aortic atherosclerosis. IMPRESSION: 1. Support apparatus, as above. 2. Otherwise, unchanged radiographic appearance the chest, as above. Electronically Signed   By: Vinnie Langton M.D.   On: 11/21/2018 05:13   Dg Chest Port 1 View  Result Date: 11/20/2018 CLINICAL DATA:  Acute respiratory failure EXAM: PORTABLE CHEST 1 VIEW COMPARISON:  Yesterday FINDINGS: Endotracheal tube tip at the clavicular heads. There gastric tube at least reaches the stomach Port with tip at the right atrium, deviated to the left due to intubation of the coronary sinus. Improved aeration on the right with some aerated lung. There is still extensive opacity and volume loss on the right. Atelectasis and pleural fluid on the left. IMPRESSION: 1. Stable hardware positioning, including port cannulating  the coronary sinus. 2. Atelectasis and pleural fluid. There is improved aeration of the right lung. Electronically Signed   By: Monte Fantasia M.D.   On: 11/20/2018 07:24   Dg Chest Port 1 View  Result Date: 11/19/2018 CLINICAL DATA:  Post bronchoscopy EXAM: PORTABLE CHEST 1 VIEW COMPARISON:  11/19/2018 FINDINGS: The endotracheal tube terminates above the carina. The right-sided Port-A-Cath is stable ED deep in the right atrium. There is slight improved aeration of the right hemithorax. The cardiac silhouette appears stable. There is no right-sided pneumothorax. No left-sided pneumothorax. The left lung base is essentially unchanged. IMPRESSION: 1. Lines and tubes as above. 2. Slightly improved aeration of the right hemithorax, otherwise stable appearance of the chest Electronically Signed   By: Constance Holster M.D.   On: 11/19/2018 19:38    Scheduled Meds: . acetylcysteine  2 mL Nebulization Q6H  . amiodarone  150 mg Intravenous Once  . budesonide (PULMICORT) nebulizer solution  0.5 mg Nebulization Daily  . chlorhexidine gluconate (MEDLINE KIT)  15 mL Mouth Rinse BID  . Chlorhexidine Gluconate Cloth  6 each Topical Daily  . famotidine  20 mg Oral Daily  . feeding supplement (PRO-STAT SUGAR FREE 64)  30 mL Per Tube TID  . heparin  5,000 Units Subcutaneous Q8H  . insulin aspart  0-15 Units Subcutaneous Q4H  . insulin aspart  4 Units Subcutaneous Q4H  . insulin glargine  10 Units Subcutaneous QHS  . levalbuterol  0.63 mg Nebulization Q6H  . liver oil-zinc oxide   Topical BID  . mouth rinse  15 mL Mouth Rinse 10 times per day  . sennosides  10 mL Oral BID  . sodium bicarbonate  650 mg Per Tube TID  . sodium chloride HYPERTONIC  4 mL Nebulization BID   Continuous Infusions: . sodium chloride Stopped (11/21/18 1256)  . amiodarone (CORDARONE) infusion  Followed by  . amiodarone (CORDARONE) infusion    . azithromycin 250 mL/hr at 11/20/18 1500  . cefTRIAXone (ROCEPHIN)  IV 2 g  (11/21/18 1414)  . dexmedetomidine (PRECEDEX) IV infusion Stopped (11/21/18 1259)  . diltiazem (CARDIZEM) infusion Stopped (11/21/18 1247)  . feeding supplement (VITAL AF 1.2 CAL) 1,000 mL (11/21/18 0019)   PRN Meds:.sodium chloride, bisacodyl, fentaNYL  CARDIAC STUDIES:  EKG 11/21/2018: Sinus rhythm, left axis deviation.  Telemetry 05//23/2020: Afib w/RVR w/PCVC and occasional NSVT  Echocardiogram 11/04/2018:   1. The left ventricle has low normal systolic function, with an ejection fraction of 50-55%. The cavity size was mildly dilated. There is mild concentric left ventricular hypertrophy. Left ventricular diastolic Doppler parameters are consistent with  impaired relaxation.  2. The right ventricle has moderately reduced systolic function. The cavity was mildly enlarged. There is no increase in right ventricular wall thickness.  3. Small pericardial effusion.  4. Small circumferential pericardial effusion measuring 0.8 cm.  5. No evidence of mitral valve stenosis.  6. The aortic valve is tricuspid. No stenosis of the aortic valve.  7. 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  8. The inferior vena cava was dilated in size with <50% respiratory variability. I personally reviewed the echocardiogram. LVED apprears to be low normal.   Assessment & Recommendations:  78 y.o. Caucasian male  with history of non-small cell lung carcinoma, status post chemotherapy, uncontrolled type 2 diabetes mellitus, CKD stage III, now critically ill with with acute hypoxic and hypercarbic respiratory failure, and acute metabolic encephalopathy, AKI.  Cardiology consulted for management of atrial fibrillation.  Paroxysmal Afib: Currently, he is converted to normal sinus rhythm. While in Afib, he was in RVR 170s, along with hypotension. Recommend avoiding diltiazem. He will benefit from amiodarone 200 mg bid to maintain sinus rhythm. CHA2DS2VAsc score 4, annual stroke risk  4.8%. Recommend heparin for anticoagulation, while critically ill. OAC/DOAC could be considered when he recovers. Options include warfarin or eliquis '5mg'$  bid.  Troponin elevation: Likely has underlying CAD, with type 2 MI. Do not recommend invasive workup at this time, especially in light of renal dysfunction. Continue supportive care, as you are doing.   Mod RV systolic dysfunction: Differentials include acute on chronic hypercarbic respiratory failure, as well as PE. On exam, he does not appear to be in florid right sided heart failure.   Pericardial effusion: Hemodynamically nonsignifciant   Nigel Mormon, MD 11/21/2018, 2:31 PM [facility] Pager: [phone] Office: [phone] If no answer Cell [phone]

## 2018-11-21 NOTE — Progress Notes (Addendum)
NAME:  Edward Hahn, MRN:  [id number], DOB:  05/12/1941, LOS: 3 ADMISSION DATE:  11/14/2018, CONSULTATION DATE:  11/23/2018 REFERRING MD:  ED [facility], CHIEF COMPLAINT: Respiratory failure  Brief History   Patient unable to communicate, history from records from [facility].  78 year old man presents with increased shortness of breath and generalized weakness.  Found to have hypercarbic respiratory failure.  Intubated after failed trial of BiPAP.  PCO2 was up to 99 today. 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 Coronary artery disease and known congestive heart failure and recent admission for non-ST elevation MI. Significant Hospital Events   Transferred to [facility] 5/20 after presenting to outside hospital with right lung opacification, and respiratory failure 5/21: Bronchoscopy performed at bedside for diagnostic evaluation of the airway.  "Thick inspissated mucus seen in right mainstem bronchus.  Took multiple passes in multiple lavages to suction this out in pieces.  Mucous plugs suctioned out.  Bronchi could be better visualized now.  Right upper lobe bronchus appeared to be distally blocked?  Old scarring brushings obtained from this area.  Right lower lobe bronchi appeared open once mucus suctioned".  Remains sedated for intermittent agitation.  Started ceftriaxone and azithromycin for possible community acquired pneumonia, diuresis initiated for possible element of diastolic dysfunction.  Also concerned about possible recurrence of non-small cell lung cancer 5/22: Wound ostomy nurse consulted for traumatic injury to left foot toes #2 and 3 felt secondary to possiby pressure from shoes.   Also had moisture injury on coccyx   Consults:  PCCM  Procedures:  CTc hest 5/20 >> right hilar lymphadenopathy, completely collapsed/consolidated right lung bronchoscopy  5/21 copious thick secretions/  brushings obtained from right upper lobe  Significant Diagnostic Tests:  CXR 5/20 - Right lung opacification. ETT in position. (personally reviewed) - unchanged according to reports from [facility]. Echocardiogram 5/20: Ejection fraction 50 to 55% with cavity mildly dilated.  Doppler parameters consistent with impaired relaxation, there was mildly reduced right ventricular systolic function and the cavity was mildly enlarged there was small circumferential pericardial effusion  ECHO 5/20> LVEF 50-55%. Moderately reduced systolic function. Small pericardial effusion.   CXR 5/22> R sided consolidation, volume loss. Patchy LLL consolidation. Bilalteral pleural effusion Micro Data:  BAl 5/21 >> Rare WBC Blood cultures 5/20 > NGTD COVID-19 5/20: Negative BAL  5/21 >  Wbc,s no org seen >>>  Antimicrobials:  azithromycin 5/21>>> Ceftriaxone 5/21>>>  Interim history/subjective:  Hyperglycemic overnight Weaning precedex  New AF with RVR   Objective   Blood pressure (!) 122/54, pulse 84, temperature 99.9 F (37.7 C), temperature source Axillary, resp. rate 20, height _0  (1.88 m), weight 110.4 kg, SpO2 91 %.    Vent Mode: PRVC FiO2 (%):  [40 %] 40 % Set Rate:  [18 bmp] 18 bmp Vt Set:  [650 mL] 650 mL PEEP:  [5 cmH20] 5 cmH20 Plateau Pressure:  [22 cmH20-27 cmH20] 25 cmH20   Intake/Output Summary (Last 24 hours) at 11/21/2018 0818 Last data filed at 11/21/2018 0725 Gross per 24 hour  Intake 2418.3 ml  Output 3345 ml  Net -926.7 ml   Filed Weights   11/19/18 0432 11/20/18 0418 11/21/18 0150  Weight: 115 kg 106.5 kg 110.4 kg   Vent  Mode: PRVC FiO2 (%):  [40 %] 40 % Set Rate:  [18 bmp] 18 bmp Vt Set:  [650 mL] 650 mL PEEP:  [5 cmH20] 5 cmH20 Plateau Pressure:  [22  RCV89-38 cmH20] 25 cmH20  Examination:  General: elderly chronically ill appearing male, intubated NAD HEENT NCAT anicteric sclera, pink mmm, traceha midline, ETT OGT secure Pulmonary: Diffuse rhonchi R>L. Symmetrical excursion. Synchronous with vent Cardiac: RRR s1s2 no rgm No JVD 1+ radial pulses Abdomen: Soft round ndnt bowel sounds x4 GU: Clear , yellow urine Extremities: Clean, warm, dry. open blisters on L foot Neuro: RASS 0 Awake, alert, following basic commands. PERRL   Resolved Hospital Problem list   Not applicable  Assessment & Plan:   Acute respiratory failure with mixed hypoxia and hypercarbia in setting of right sided atelectasis -, Status post bronchoscopy 5/21: Specimens were sent for culture and cytology. Portable chest x-ray reviewed for 5/22: This demonstrates the following endotracheal tube was in satisfactory position.  Currently at the apex of the carina.  There continues to be significant right sided collapse and airspace disease, which continues to marginally improve -Bilateral pleural effusions -Failed SBT 5/22 due to poor Vt  Plan WUA/SBT Mechanical vent support Complete 5 day course azithromycin and 7 day course ceftriaxone  VAP bundle Hypertonic saline nebs  Brovana and Pulmicort CPT Cytology shows no malignancy  BAL show rare WBC AM CXR  Acute diastolic heart failure -Echocardiogram shows normal EF, decreased RV SF, small pericardial effusion -Tolerating diuresis; currently 6.2 L negative -Home med: Tenormin Plan Continue diuresis, 28m BID   Continue to follow BMP As progresses, can consider addition of low dose coreg  Acute metabolic encephalopathy in setting of hypoxia, possible infection Plan PAD protocol RAS goal 0 Continue precedex gtt, PRN fent RASs goal 0  Nursing delirium precautions  History of non-small cell lung cancer Possible recurrence of metastatic disease - CT scan of chest -post bronchoscopy Plan Cytology report:  no malgnant cells   CKD 3, possible AKI on CKD -Creatinine tolerating diuresis Plan BID lasix as above.  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 Pain/Anxiety/Delirium protocol (if indicated): Precedex, PRN fent VAP protocol (if indicated): Bundle in place DVT prophylaxis: Unfractionated heparin GI prophylaxis: Famotidine Glucose control: Phase 1 glycemic protocol Mobility: Bedrest Code Status: Partial (no CPR, No CV)  Family Communication: Pending  Disposition: ICU  Critical Care Time 40 minutes    GEliseo GumMSN, AGACNP-BC [phone] no answer, [phone]/23/2020, 8:19 AM

## 2018-11-21 NOTE — Progress Notes (Signed)
PCCM brief progress note   Re-assessing patient at bedside for Atrial Fibrillation  Dilt gtt had been initiated this morning after new onset Afib, patient remains with rate >140. Pressures are variable with intermittent hypotension. Currently 95/58 MAP 71. Remains intubated. No change in level of consciousness Patient has been discussed with Dr. Melvyn Novas PCCM   Plan -Check BMP, Mag, Phos, TSH free T4 -Amio bolus and gtt -Cardiology has been paged for consult, awaiting return page  -Continue telemetry   Eliseo Gum MSN, AGACNP-BC [place] [phone] If no answer, [phone] 11/21/2018, 1:55 PM

## 2018-11-21 NOTE — Progress Notes (Signed)
Consulted with cardiology fellow on call about amiodarone IV dosing now that patient has converted back to NSR. Given instruction to continue with amiodarone IV drip and started as directed at maintenance dose.   Edward Hahn .11/21/18 2100

## 2018-11-21 NOTE — Progress Notes (Signed)
Assisted tele visit to patient with family Currie Paris RN

## 2018-11-21 NOTE — Progress Notes (Signed)
Inpatient Diabetes Program Recommendations  AACE/ADA: New Consensus Statement on Inpatient Glycemic Control (2015)  Target Ranges:  Prepandial:   less than 140 mg/dL      Peak postprandial:   less than 180 mg/dL (1-2 hours)      Critically ill patients:  140 - 180 mg/dL   Lab Results  Component Value Date   GLUCAP 356 (H) 11/21/2018    Review of Glycemic Control  Diabetes history: DM 2 Outpatient Diabetes medications: Lantus 40 units BID, Humalog 20 units tid, Tradjenta 5 mg Daily  Current orders for Inpatient glycemic control:  Lantus 20 units Daily Novolog 0-15 units Q4 hours Novolog 4 units Q 4 Tube Feed Coverage   Inpatient Diabetes Program Recommendations:    Patient receiving: Vital AF 65 ml/hr   -  Consider Lantus 15 units BID   -  Consider increasing Novolog tube feed coverage to 8 units Q4 hours   Thanks,  Tama Headings RN, MSN, BC-ADM Inpatient Diabetes Coordinator Team Pager [phone] (8a-5p)

## 2018-11-21 NOTE — Progress Notes (Signed)
eLink Physician-Brief Progress Note Patient Name: Edward Hahn DOB: 04-May-1941 MRN: [id number]   Date of Service  11/21/2018  HPI/Events of Note  Asking for prn fenta pushes. On Vent for AHRF/ diastolic CHF/hyperglycemia. Lung cancer. Encephalopathy.   eICU Interventions  Fenta 25 mcg prn q2 hrs ordered.      Intervention Category Intermediate Interventions: Pain - evaluation and management  Elmer Sow 11/21/2018, 7:35 PM

## 2018-11-21 NOTE — Progress Notes (Signed)
PCCM Brief Communication Note  Called to provide daily updates to patient's son. All questions answered.    Eliseo Gum MSN, AGACNP-BC [place] [phone] If no answer, [phone] 11/21/2018, 12:01 PM

## 2018-11-22 ENCOUNTER — Inpatient Hospital Stay (HOSPITAL_COMMUNITY): Payer: Medicare Other

## 2018-11-22 LAB — GLUCOSE, CAPILLARY
Glucose-Capillary: 206 mg/dL — ABNORMAL HIGH (ref 70–99)
Glucose-Capillary: 221 mg/dL — ABNORMAL HIGH (ref 70–99)
Glucose-Capillary: 232 mg/dL — ABNORMAL HIGH (ref 70–99)
Glucose-Capillary: 262 mg/dL — ABNORMAL HIGH (ref 70–99)
Glucose-Capillary: 262 mg/dL — ABNORMAL HIGH (ref 70–99)
Glucose-Capillary: 296 mg/dL — ABNORMAL HIGH (ref 70–99)

## 2018-11-22 LAB — CBC
HCT: 35.7 % — ABNORMAL LOW (ref 39.0–52.0)
HCT: 37.2 % — ABNORMAL LOW (ref 39.0–52.0)
Hemoglobin: 10.3 g/dL — ABNORMAL LOW (ref 13.0–17.0)
Hemoglobin: 10.6 g/dL — ABNORMAL LOW (ref 13.0–17.0)
MCH: 27.5 pg (ref 26.0–34.0)
MCH: 27.2 pg (ref 26.0–34.0)
MCHC: 28.9 g/dL — ABNORMAL LOW (ref 30.0–36.0)
MCHC: 28.5 g/dL — ABNORMAL LOW (ref 30.0–36.0)
MCV: 95.5 fL (ref 80.0–100.0)
MCV: 95.6 fL (ref 80.0–100.0)
Platelets: 147 10*3/uL — ABNORMAL LOW (ref 150–400)
Platelets: 144 10*3/uL — ABNORMAL LOW (ref 150–400)
RBC: 3.74 MIL/uL — ABNORMAL LOW (ref 4.22–5.81)
RBC: 3.89 MIL/uL — ABNORMAL LOW (ref 4.22–5.81)
RDW: 16.6 % — ABNORMAL HIGH (ref 11.5–15.5)
RDW: 16.8 % — ABNORMAL HIGH (ref 11.5–15.5)
WBC: 4.9 10*3/uL (ref 4.0–10.5)
WBC: 4.7 10*3/uL (ref 4.0–10.5)
nRBC: 0 % (ref 0.0–0.2)
nRBC: 0 % (ref 0.0–0.2)

## 2018-11-22 LAB — BASIC METABOLIC PANEL
Anion gap: 13 (ref 5–15)
BUN: 89 mg/dL — ABNORMAL HIGH (ref 8–23)
CO2: 27 mmol/L (ref 22–32)
Calcium: 8.6 mg/dL — ABNORMAL LOW (ref 8.9–10.3)
Chloride: 111 mmol/L (ref 98–111)
Creatinine, Ser: 2.83 mg/dL — ABNORMAL HIGH (ref 0.61–1.24)
GFR calc Af Amer: 24 mL/min — ABNORMAL LOW (ref >60)
GFR calc non Af Amer: 21 mL/min — ABNORMAL LOW (ref >60)
Glucose, Bld: 255 mg/dL — ABNORMAL HIGH (ref 70–99)
Potassium: 3.1 mmol/L — ABNORMAL LOW (ref 3.5–5.1)
Sodium: 151 mmol/L — ABNORMAL HIGH (ref 135–145)

## 2018-11-22 LAB — HEPARIN LEVEL (UNFRACTIONATED): Heparin Unfractionated: 0.37 IU/mL (ref 0.30–0.70)

## 2018-11-22 LAB — PHOSPHORUS: Phosphorus: 3.4 mg/dL (ref 2.5–4.6)

## 2018-11-22 LAB — MAGNESIUM: Magnesium: 1.8 mg/dL (ref 1.7–2.4)

## 2018-11-22 MED ORDER — MAGNESIUM SULFATE IN D5W 1-5 GM/100ML-% IV SOLN
1.0000 g | Freq: Once | INTRAVENOUS | Status: AC
  Administered 2018-11-22: 15:00:00 1 g via INTRAVENOUS
  Filled 2018-11-22: qty 100

## 2018-11-22 MED ORDER — INSULIN GLARGINE 100 UNIT/ML ~~LOC~~ SOLN
30.0000 [IU] | Freq: Every day | SUBCUTANEOUS | Status: DC
  Administered 2018-11-23 – 2018-11-25 (×3): 30 [IU] via SUBCUTANEOUS
  Filled 2018-11-22 (×4): qty 0.3

## 2018-11-22 MED ORDER — AMIODARONE HCL 200 MG PO TABS: 200.0000 mg | ORAL_TABLET | Freq: Two times a day (BID) | ORAL | Status: DC

## 2018-11-22 MED ORDER — POTASSIUM CHLORIDE 20 MEQ/15ML (10%) PO SOLN
40.0000 meq | Freq: Two times a day (BID) | ORAL | Status: AC
  Administered 2018-11-22 (×2): 40 meq
  Filled 2018-11-22 (×2): qty 30

## 2018-11-22 MED ORDER — HEPARIN BOLUS VIA INFUSION
5000.0000 [IU] | Freq: Once | INTRAVENOUS | Status: AC
  Administered 2018-11-22: 13:00:00 5000 [IU] via INTRAVENOUS
  Filled 2018-11-22: qty 5000

## 2018-11-22 MED ORDER — METOPROLOL TARTRATE 25 MG PO TABS
25.0000 mg | ORAL_TABLET | Freq: Two times a day (BID) | ORAL | Status: DC
  Administered 2018-11-22 – 2018-11-24 (×6): 25 mg via ORAL
  Filled 2018-11-22 (×6): qty 1

## 2018-11-22 MED ORDER — HEPARIN (PORCINE) 25000 UT/250ML-% IV SOLN
1900.0000 [IU]/h | INTRAVENOUS | Status: DC
  Administered 2018-11-22 – 2018-11-23 (×2): 1500 [IU]/h via INTRAVENOUS
  Administered 2018-11-23: 18:00:00 1700 [IU]/h via INTRAVENOUS
  Administered 2018-11-24 – 2018-11-26 (×4): 1900 [IU]/h via INTRAVENOUS
  Filled 2018-11-22 (×8): qty 250

## 2018-11-22 MED ORDER — AMIODARONE HCL 200 MG PO TABS
200.0000 mg | ORAL_TABLET | Freq: Two times a day (BID) | ORAL | Status: DC
  Administered 2018-11-22 – 2018-11-25 (×7): 200 mg
  Filled 2018-11-22 (×7): qty 1

## 2018-11-22 MED ORDER — INSULIN ASPART 100 UNIT/ML ~~LOC~~ SOLN
8.0000 [IU] | SUBCUTANEOUS | Status: DC
  Administered 2018-11-23 (×3): 8 [IU] via SUBCUTANEOUS

## 2018-11-22 NOTE — Progress Notes (Addendum)
NAME:  Edward Hahn, MRN:  [id number], DOB:  [birth year], LOS: 4 ADMISSION DATE:  11/29/2018, CONSULTATION DATE:  11/15/2018 REFERRING MD:  ED [facility], CHIEF COMPLAINT: Respiratory failure  Brief History   Patient unable to communicate, history from records from [facility].  78 year old man presented with increased shortness of breath and generalized weakness.  Found to have hypercarbic respiratory failure.  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 Coronary artery disease and known congestive heart failure and recent admission for non-ST elevation MI. Significant Hospital Events   [facility] after presenting to outside hospital with right lung opacification, and respiratory failure 5/21: Bronchoscopy performed at bedside for diagnostic evaluation of the airway.  "Thick inspissated mucus seen in right mainstem bronchus.  Took multiple passes in multiple lavages to suction this out in pieces.  Mucous plugs suctioned out.  Bronchi could be better visualized now.  Right upper lobe bronchus appeared to be distally blocked?  Old scarring brushings obtained from this area.  Right lower lobe bronchi appeared open once mucus suctioned".  Remains sedated for intermittent agitation.  Started ceftriaxone and azithromycin for possible community acquired pneumonia, diuresis initiated for possible element of diastolic dysfunction.  Also concerned about possible recurrence of non-small cell lung cancer 5/22: Wound ostomy nurse consulted for traumatic injury to left foot toes #2 and 3 felt secondary to possiby pressure from  shoes.  Also had moisture injury on coccyx 5/23 cardiology consulted for new A fib >  NSR same day. Started heparin gtt for anticoagulation    Consults:  PCCM Cardiology  Procedures:  CTc hest 5/20 >> right hilar lymphadenopathy, completely collapsed/consolidated right lung bronchoscopy  5/21 copious thick secretions/  brushings obtained from right upper lobe  Significant Diagnostic Tests:  CXR 5/20 - Right lung opacification. ETT in position. (personally reviewed) - unchanged according to reports from [facility]. Echocardiogram 5/20: Ejection fraction 50 to 55% with cavity mildly dilated.  Doppler parameters consistent with impaired relaxation, there was mildly reduced right ventricular systolic function and the cavity was mildly enlarged there was small circumferential pericardial effusion  ECHO 5/20> LVEF 50-55%. Moderately reduced systolic function. Small pericardial effusion.   CXR 5/22> R sided consolidation, volume loss. Patchy LLL consolidation. Bilalteral pleural effusion  CXR 5/24> R sided opacities, persistent LLL consolidation. Personally reviewed  Micro Data:  BAL 5/21 >> Rare WBC, no organisms seen  Blood cultures 5/20 > NGTD COVID-19 5/20: Negative Brushings cytopathology 5/21> no malignant cells seen   Antimicrobials:  azithromycin 5/21>>> Ceftriaxone 5/21>>>  Interim history/subjective:  Sinus rhythm overnight Tolerating precedex well Tolerating PSV/CPAP well this morning with Vt > 400   Objective   Blood pressure 117/76, pulse 93, temperature 99 F (37.2 C), temperature source Oral, resp. rate 19, height _0  (1.88 m), weight 108 kg, SpO2 97 %.    Vent Mode: PRVC FiO2 (%):  [40 %] 40 % Set Rate:  [18 bmp] 18 bmp Vt Set:  [650 mL] 650 mL PEEP:  [5 cmH20] 5 cmH20 Plateau Pressure:  [22 cmH20-26 cmH20] 22 cmH20   Intake/Output Summary (Last 24 hours) at 11/22/2018 3382 Last data filed at  11/22/2018 0700 Gross per 24 hour  Intake 3399.97 ml  Output  1901 ml  Net 1498.97 ml   Filed Weights   11/20/18 0418 11/21/18 0150 11/22/18 0500  Weight: 106.5 kg 110.4 kg 108 kg   Vent Mode: PRVC FiO2 (%):  [40 %] 40 % Set Rate:  [18 bmp] 18 bmp Vt Set:  [650 mL] 650 mL PEEP:  [5 cmH20] 5 cmH20 Plateau Pressure:  [22 cmH20-26 cmH20] 22 cmH20  Examination:  General: Elderly chronically and critically ill appearing male, intubated lightly sedated NAD  HEENT NCAT ETT OGT secure. Pink mmm. Trachea midline. Anicteric sclera  Pulmonary: Diffuse rhonchi R sided > Left side. Symmetrical chest expansion. No accessory muscle recruitment on PSV/CPAP  Cardiac:  RRR s1s2 no rgm no JVD  2+ radial pulses   GU: Clear yellow urine via foley  Extremities:  Clean dry warm, open blister to foot without erythema or purulence  Neuro: RASS 0, Awake alert, following commands. PERRL    Resolved Hospital Problem list   Not applicable  Assessment & Plan:   Acute respiratory failure with hypoxia and hypercarbia -in setting of R sided atelectasis  -, Status post bronchoscopy 5/21 copious mucus plugs  -CXR 5/24 with recurrent  R sided opacification  bilateral pleural effusions  -Tolerating PSV/CPAP 5/24  -BAL on 5/21 shows no organisms, brushing cytopath shows no malignant cells  Plan WUA/SBT Resume PRVC when not tolerating PSV/CPAP Complete 5 day azithromycin and 7 day ceftriaxone Hypertonic saline nebs Brovana, Pulmicort Acetylcysteine c 24/48h planned CPT  Acute diastolic heart failure  Paroxysmal atrial fibrillation  -Echocardiogram shows normal EF, decreased RV SF, small pericardial effusion -Home med: Tenormin - TFTs on 5/23ok Plan  Cardiology consulted 5/23 for A fib, appreciate recs  Lasix 40 BID Trend UOP, BUN/Cr, electrolytes Amiodarone 200 BID  Heparin gtt   Acute metabolic encephalopathy in setting of hypoxia, possible infection Plan RASS goal 0  RN Delirium precautions Precedex gtt   History of non-small cell lung cancer Possible  recurrence of metastatic disease Plan Cytology report: no malgnant cells   AKI on CKD 3  -Creatinine tolerating diuresis Plan BID lasix as above Hold NSAIDS given baseline renal function Continue home TID bicarb Follow BMP, UOP, lytes   Protein calorie malnutrition Plan EN per RD consult   DM2 with acute hyperglycemia Plan Lantus 20u qD SSI 4u Novolog q4hr    Best practice:  Diet: EN Pain/Anxiety/Delirium protocol (if indicated): Precedex, PRN fent VAP protocol (if indicated): Bundle in place DVT prophylaxis: Unfractionated heparin GI prophylaxis: Famotidine Glucose control: Lantus, SSI  Mobility: Bedrest Code Status: Partial (no CPR, No CV)  Family Communication: Pending  Disposition: ICU  Critical Care Time 35 minutes   Eliseo Gum MSN, AGACNP-BC [place] [phone] If no answer, [phone] 11/22/2018, 7:43 AM   I, Dr. Shellee Milo, have personally reviewed patient's available data, including medical history, events of note, physical examination and test results as part of my evaluation. I have discussed with NP Eliseo Gum  and other care providers all aspects of the patients PCCM issues as listed.  In addition,  I have personally evaluated the patient and assisted in the formulation of the management plan.   It's hard to tell how much the effusions are contributing to opacities but the volume loss on R suggests this problem is mostly related to recurrent mucus plugging despite adding mucomsyt 5/23 and if no better 5/25 rec  1) consider repeat fob/ lavage 2) bedside u/s  and tap any large volumes for therapeutic and dx purposes   Christinia Gully, MD [facility] [place] [phone] After 5:30 PM or weekends, use [facility] [phone]

## 2018-11-22 NOTE — Progress Notes (Signed)
eLink Physician-Brief Progress Note Patient Name: Edward Hahn DOB: 27-Aug-1940 MRN: [id number]   Date of Service  11/22/2018  HPI/Events of Note  Hyperglycemia - Blood glucose = 262. Diabetes educator recommends increasing tube feed coverage from 4 units to 8 units Q 4 hours.   eICU Interventions  Will order: 1. Increase tube feed coverage to 8 units Q 4 hours.      Intervention Category Major Interventions: Hyperglycemia - active titration of insulin therapy  Lysle Dingwall 11/22/2018, 8:24 PM

## 2018-11-22 NOTE — Progress Notes (Signed)
ANTICOAGULATION CONSULT NOTE - Follow-Up Consult  Pharmacy Consult for heparin Indication: atrial fibrillation  Not on File  Patient Measurements: Height: 6\' 2"  (188 cm) Weight: 238 lb 1.6 oz (108 kg) IBW/kg (Calculated) : 82.2 Heparin Dosing Weight: 100kg  Vital Signs: Temp: 99.5 F (37.5 C) (05/24 1955) Temp Source: Oral (05/24 1955) BP: 140/80 (05/24 1900) Pulse Rate: 121 (05/24 1928)  Labs: Recent Labs    11/21/18 0341 11/21/18 1359 11/22/18 0648 11/22/18 0936 11/22/18 1943  HGB  --   --  10.3* 10.6*  --   HCT  --   --  35.7* 37.2*  --   PLT  --   --  147* 144*  --   HEPARINUNFRC  --   --   --   --  0.37  CREATININE 3.09* 2.91* 2.83*  --   --     Estimated Creatinine Clearance: 28.6 mL/min (A) (by C-G formula based on SCr of 2.83 mg/dL (H)).   Medical History: Past Medical History:  Diagnosis Date  . CKD (chronic kidney disease)   . Non-small cell lung cancer (HCC)      Assessment: 17 yoM with new onset AFib started on heparin infusion earlier this morning. Initial heparin level therapeutic at 0.37, will confirm with morning labs.  Goal of Therapy:  Heparin level 0.3-0.7 units/ml Monitor platelets by anticoagulation protocol: Yes   Plan:  -Continue heparin 1500 units/hr -Recheck heparin level and CBC with morning labs   Arrie Senate, PharmD, BCPS Clinical Pharmacist [phone] Please check AMION for all [place] numbers 11/22/2018

## 2018-11-22 NOTE — Progress Notes (Addendum)
Subjective:  Unable to obtain. Intubated. Intermittent episodes of Afib w/RVR  Objective:  Vital Signs in the last 24 hours: Temp:  [98.8 F (37.1 C)-100.3 F (37.9 C)] 99.2 F (37.3 C) (05/24 0735) Pulse Rate:  [67-129] 114 (05/24 1200) Resp:  [15-25] 24 (05/24 1200) BP: (75-162)/(47-96) 149/73 (05/24 1200) SpO2:  [89 %-100 %] 95 % (05/24 1200) FiO2 (%):  [40 %] 40 % (05/24 1107) Weight:  [622 kg] 108 kg (05/24 0500)  Intake/Output from previous day: 05/23 0701 - 05/24 0700 In: 3410.3 [I.V.:450.4; NG/GT:1188.8; IV Piggyback:1771] Out: 2 [Urine:1900; Stool:1]  Physical Exam Constitutional: He appears well-developed and well-nourished. No distress.  HENT:  Head: Normocephalic and atraumatic.  Eyes: Pupils are equal, round, and reactive to light. Conjunctivae are normal.  Neck: No JVD present.  Cardiovascular: An irregularly irregular rhythm present. Tachycardia present.  Pulmonary/Chest: Effort normal and breath sounds normal. He has no wheezes. He has no rales.  Abdominal: Soft. Bowel sounds are normal. There is no rebound.  Musculoskeletal:        General: No edema.  Lymphadenopathy:    He has no cervical adenopathy.  Neurological: No cranial nerve deficit.  Intubated, awake, following commands Skin: Skin is warm and dry.  Psychiatric: He has a normal mood and affect.  Nursing note and vitals reviewed.  Lab Results: BMP Recent Labs    11/21/18 0341 11/21/18 1359 11/22/18 0648  NA 146* 146* 151*  K 3.7 3.6 3.1*  CL 105 105 111  CO2 26 27 27   GLUCOSE 331* 398* 255*  BUN 84* 89* 89*  CREATININE 3.09* 2.91* 2.83*  CALCIUM 8.3* 8.2* 8.6*  GFRNONAA 18* 20* 21*  GFRAA 21* 23* 24*    CBC Recent Labs  Lab 11/02/2018 1606  11/22/18 0936  WBC 4.1   < > 4.7  RBC 3.69*   < > 3.89*  HGB 10.2*   < > 10.6*  HCT 36.0*   < > 37.2*  PLT 127*   < > 144*  MCV 97.6   < > 95.6  MCH 27.6   < > 27.2  MCHC 28.3*   < > 28.5*  RDW 16.0*   < > 16.8*  LYMPHSABS 0.5*  --    --   MONOABS 0.3  --   --   EOSABS 0.0  --   --   BASOSABS 0.0  --   --    < > = values in this interval not displayed.    HEMOGLOBIN A1C No results found for: HGBA1C, MPG  Cardiac Panel (last 3 results) Recent Labs    11/24/2018 1606 10/30/2018 2331 11/19/18 0511  TROPONINI 0.09* 0.14* 0.19*    BNP (last 3 results) Recent Labs    11/21/2018 1606  BNP 1,065.3*    TSH Recent Labs    11/21/18 1359  TSH 0.514    Lipid Panel  No results found for: CHOL, TRIG, HDL, CHOLHDL, VLDL, LDLCALC, LDLDIRECT   Hepatic Function Panel Recent Labs    11/17/2018 1606  PROT 6.7  ALBUMIN 2.5*  AST 8*  ALT 11  ALKPHOS 128*  BILITOT 0.6   CARDIAC STUDIES:  EKG 11/21/2018: Sinus rhythm, left axis deviation.  Telemetry 11/22/2018: Afib w/RVR w/PCVC and occasional NSVT  Echocardiogram 11/03/2018:  1. The left ventricle has low normal systolic function, with an ejection fraction of 50-55%. The cavity size was mildly dilated. There is mild concentric left ventricular hypertrophy. Left ventricular diastolic Doppler parameters are consistent with  impaired relaxation. 2.  The right ventricle has moderately reduced systolic function. The cavity was mildly enlarged. There is no increase in right ventricular wall thickness. 3. Small pericardial effusion. 4. Small circumferential pericardial effusion measuring 0.8 cm. 5. No evidence of mitral valve stenosis. 6. The aortic valve is tricuspid. No stenosis of the aortic valve. 7. 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 8. The inferior vena cava was dilated in size with <50% respiratory variability. I personally reviewed the echocardiogram. LVED apprears to be low normal.   Assessment & Recommendations:  78 y.o. Caucasian male  with history of non-small cell lung carcinoma, status post chemotherapy, uncontrolled type 2 diabetes mellitus, CKD stage III, now critically ill with with acute hypoxic and  hypercarbic respiratory failure, and acute metabolic encephalopathy, AKI.  Cardiology consulted for management of atrial fibrillation.  Paroxysmal Afib: Currently, he is converted to normal sinus rhythm. While in Afib, he was in RVR 170s, along with hypotension on 05/24. Continue amiodarone 200 mg PO bid. Added metoprolol 25 mg PO bid, now with better blood pressure. CHA2DS2VAsc score 4, annual stroke risk 4.8%. Recommend heparin for anticoagulation, while critically ill. OAC/DOAC could be considered when he recovers. Options include warfarin or eliquis 5mg  bid.  Troponin elevation: Likely has underlying CAD, with type 2 MI. Do not recommend invasive workup at this time, especially in light of renal dysfunction. Continue supportive care, as you are doing.   Mod RV systolic dysfunction: Differentials include acute on chronic hypercarbic respiratory failure, as well as PE. On exam, he does not appear to be in florid right sided heart failure.   Pericardial effusion: Hemodynamically nonsignifciant  Nigel Mormon, M.D. 11/22/2018, 1:14 PM [facility] Pager: [phone] Office: [phone] If no answer: [phone]

## 2018-11-23 ENCOUNTER — Inpatient Hospital Stay (HOSPITAL_COMMUNITY): Payer: Medicare Other

## 2018-11-23 DIAGNOSIS — N179 Acute kidney failure, unspecified: Secondary | ICD-10-CM

## 2018-11-23 LAB — CBC
HCT: 37.1 % — ABNORMAL LOW (ref 39.0–52.0)
Hemoglobin: 10.3 g/dL — ABNORMAL LOW (ref 13.0–17.0)
MCH: 27.1 pg (ref 26.0–34.0)
MCHC: 27.8 g/dL — ABNORMAL LOW (ref 30.0–36.0)
MCV: 97.6 fL (ref 80.0–100.0)
Platelets: 142 10*3/uL — ABNORMAL LOW (ref 150–400)
RBC: 3.8 MIL/uL — ABNORMAL LOW (ref 4.22–5.81)
RDW: 16.9 % — ABNORMAL HIGH (ref 11.5–15.5)
WBC: 4.1 10*3/uL (ref 4.0–10.5)
nRBC: 0 % (ref 0.0–0.2)

## 2018-11-23 LAB — GLUCOSE, CAPILLARY
Glucose-Capillary: 174 mg/dL — ABNORMAL HIGH (ref 70–99)
Glucose-Capillary: 195 mg/dL — ABNORMAL HIGH (ref 70–99)
Glucose-Capillary: 201 mg/dL — ABNORMAL HIGH (ref 70–99)
Glucose-Capillary: 210 mg/dL — ABNORMAL HIGH (ref 70–99)
Glucose-Capillary: 227 mg/dL — ABNORMAL HIGH (ref 70–99)
Glucose-Capillary: 231 mg/dL — ABNORMAL HIGH (ref 70–99)

## 2018-11-23 LAB — HEPARIN LEVEL (UNFRACTIONATED)
Heparin Unfractionated: 0.28 IU/mL — ABNORMAL LOW (ref 0.30–0.70)
Heparin Unfractionated: 0.37 IU/mL (ref 0.30–0.70)

## 2018-11-23 LAB — BASIC METABOLIC PANEL
Anion gap: 13 (ref 5–15)
BUN: 88 mg/dL — ABNORMAL HIGH (ref 8–23)
CO2: 28 mmol/L (ref 22–32)
Calcium: 8.7 mg/dL — ABNORMAL LOW (ref 8.9–10.3)
Chloride: 113 mmol/L — ABNORMAL HIGH (ref 98–111)
Creatinine, Ser: 2.82 mg/dL — ABNORMAL HIGH (ref 0.61–1.24)
GFR calc Af Amer: 24 mL/min — ABNORMAL LOW (ref >60)
GFR calc non Af Amer: 21 mL/min — ABNORMAL LOW (ref >60)
Glucose, Bld: 277 mg/dL — ABNORMAL HIGH (ref 70–99)
Potassium: 4.1 mmol/L (ref 3.5–5.1)
Sodium: 154 mmol/L — ABNORMAL HIGH (ref 135–145)

## 2018-11-23 LAB — CULTURE, BLOOD (ROUTINE X 2)
Culture: NO GROWTH
Culture: NO GROWTH
Special Requests: ADEQUATE

## 2018-11-23 MED ORDER — SENNOSIDES 8.8 MG/5ML PO SYRP: 10.0000 mL | ORAL_SOLUTION | Freq: Two times a day (BID) | ORAL | Status: DC | PRN

## 2018-11-23 MED ORDER — SODIUM BICARBONATE 650 MG PO TABS
650.0000 mg | ORAL_TABLET | Freq: Two times a day (BID) | ORAL | Status: DC
  Administered 2018-11-23 (×2): 650 mg
  Filled 2018-11-23 (×2): qty 1

## 2018-11-23 MED ORDER — FENTANYL CITRATE (PF) 100 MCG/2ML IJ SOLN
25.0000 ug | INTRAMUSCULAR | Status: DC | PRN
  Administered 2018-11-23 – 2018-11-24 (×2): 50 ug via INTRAVENOUS
  Filled 2018-11-23 (×2): qty 2

## 2018-11-23 MED ORDER — FREE WATER
200.0000 mL | Status: DC
  Administered 2018-11-23 – 2018-11-25 (×12): 200 mL

## 2018-11-23 MED ORDER — INSULIN ASPART 100 UNIT/ML ~~LOC~~ SOLN
10.0000 [IU] | SUBCUTANEOUS | Status: DC
  Administered 2018-11-23 – 2018-11-24 (×5): 10 [IU] via SUBCUTANEOUS

## 2018-11-23 NOTE — Progress Notes (Signed)
[place] for heparin Indication: atrial fibrillation  Patient Measurements: Height: 6\' 2"  (188 cm) Weight: 240 lb 1.3 oz (108.9 kg) IBW/kg (Calculated) : 82.2 Heparin Dosing Weight: 100kg  Vital Signs: Temp: 100.2 F (37.9 C) (05/25 0800) Temp Source: Oral (05/25 0800) BP: 152/79 (05/25 0737) Pulse Rate: 110 (05/25 0737)  Labs: Recent Labs    11/21/18 1359  11/22/18 0648 11/22/18 0936 11/22/18 1943 11/23/18 0334  HGB  --    < > 10.3* 10.6*  --  10.3*  HCT  --   --  35.7* 37.2*  --  37.1*  PLT  --   --  147* 144*  --  142*  HEPARINUNFRC  --   --   --   --  0.37 0.28*  CREATININE 2.91*  --  2.83*  --   --  2.82*   < > = values in this interval not displayed.     Assessment: 54 yoM with new onset AFib started on heparin infusion earlier this morning. Heparin level fell slightly subtherapeutic this morning. hgb 10.3, plts 142.    Goal of Therapy:  Heparin level 0.3-0.7 units/ml Monitor platelets by anticoagulation protocol: Yes   Plan:  -Increase heparin to 1700 units/hr -Daily HL  -Check confirmatory level this afternoon   Harvel Quale 11/23/2018 8:13 AM

## 2018-11-23 NOTE — Progress Notes (Signed)
After bathing patient not receiving volumes on the ventilator. Respiratory therapy at bedside. Mucous plug and copious secretions suctioned out with inline suction and expectorated out of patients mouth. Will continue to monitor.   Edward Hahn A Arma Reining 0120 11/23/18

## 2018-11-23 NOTE — Progress Notes (Signed)
ANTICOAGULATION CONSULT NOTE  Pharmacy Consult for heparin Indication: atrial fibrillation  Patient Measurements: Height: 6\' 2"  (188 cm) Weight: 240 lb 1.3 oz (108.9 kg) IBW/kg (Calculated) : 82.2 Heparin Dosing Weight: 100kg  Vital Signs: Temp: 99.1 F (37.3 C) (05/25 1556) Temp Source: Oral (05/25 1556) BP: 141/72 (05/25 1523) Pulse Rate: 115 (05/25 1523)  Labs: Recent Labs    11/21/18 1359  11/22/18 0648 11/22/18 0936 11/22/18 1943 11/23/18 0334 11/23/18 1549  HGB  --    < > 10.3* 10.6*  --  10.3*  --   HCT  --   --  35.7* 37.2*  --  37.1*  --   PLT  --   --  147* 144*  --  142*  --   HEPARINUNFRC  --   --   --   --  0.37 0.28* 0.37  CREATININE 2.91*  --  2.83*  --   --  2.82*  --    < > = values in this interval not displayed.     Assessment: 36 yoM with new onset AFib started on heparin infusion earlier this morning. Heparin level fell slightly subtherapeutic this morning. hgb 10.3, plts 142.   Heparin level at goal at 0.37, no bleeding or IV issues noted.   Goal of Therapy:  Heparin level 0.3-0.7 units/ml Monitor platelets by anticoagulation protocol: Yes   Plan:  -Continue heparin at 1700 units/hr -Daily HL   Erin Hearing PharmD., BCPS Clinical Pharmacist 11/23/2018 4:33 PM

## 2018-11-23 NOTE — Progress Notes (Signed)
Telemetry reviewed. Remains in sinus tachycardia, likely secondary to underlying critical illness, respiratory failure. Will sign off at this time, but please feel free to contact us again, as needed.  Nigel Mormon, MD [facility] [place] Pager: [phone] Office: [phone] If no answer Cell [phone]

## 2018-11-23 NOTE — Progress Notes (Signed)
eLink Physician-Brief Progress Note Patient Name: Edward Hahn DOB: 12/03/1940 MRN: [id number]   Date of Service  11/23/2018  HPI/Events of Note  Diarrhea - Frequent loose stools with skin breakdown. Request for Flexiseal.   eICU Interventions  Will order: 1. Place Flexiseal.     Intervention Category Major Interventions: Other:  Sommer,Steven Cornelia Copa 11/23/2018, 1:10 AM

## 2018-11-23 NOTE — Progress Notes (Signed)
NAME:  Edward Hahn, MRN:  [id number], DOB:  [birth year], LOS: 5 ADMISSION DATE:  11/11/2018, CONSULTATION DATE:  11/25/2018 REFERRING MD:  ED [facility], CHIEF COMPLAINT: Respiratory failure  Brief History   Patient unable to communicate, history from records from [facility].  78 year old man presented with increased shortness of breath and generalized weakness.  Found to have hypercarbic respiratory failure.  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 Coronary artery disease and known congestive heart failure and recent admission for non-ST elevation MI. Significant Hospital Events   Transferred to [facility] 5/20 after presenting to outside hospital with right lung opacification, and respiratory failure 5/21: Bronchoscopy performed at bedside for diagnostic evaluation of the airway.  "Thick inspissated mucus seen in right mainstem bronchus.  Took multiple passes in multiple lavages to suction this out in pieces.  Mucous plugs suctioned out.  Bronchi could be better visualized now.  Right upper lobe bronchus appeared to be distally blocked?  Old scarring brushings obtained from this area.  Right lower lobe bronchi appeared open once mucus suctioned".  Remains sedated for intermittent agitation.  Started ceftriaxone and azithromycin for possible community acquired pneumonia, diuresis initiated for possible element of diastolic dysfunction.  Also concerned about possible recurrence of non-small cell lung cancer 5/22: Wound ostomy nurse consulted for traumatic injury to left foot toes #2 and 3 felt secondary to possiby pressure from  shoes.  Also had moisture injury on coccyx 5/23 cardiology consulted for new A fib >  NSR same day. Started heparin gtt for anticoagulation  5/25: Weaning.  Looking better.  Still with significant right upper lobe collapse.  Adding Meta neb. Consults:  PCCM Cardiology  Procedures:  CTc hest 5/20 >> right hilar lymphadenopathy, completely collapsed/consolidated right lung bronchoscopy  5/21 copious thick secretions/  brushings obtained from right upper lobe  Significant Diagnostic Tests:  CXR 5/20 - Right lung opacification. ETT in position. (personally reviewed) - unchanged according to reports from [facility]. Echocardiogram 5/20: Ejection fraction 50 to 55% with cavity mildly dilated.  Doppler parameters consistent with impaired relaxation, there was mildly reduced right ventricular systolic function and the cavity was mildly enlarged there was small circumferential pericardial effusion  ECHO 5/20> LVEF 50-55%. Moderately reduced systolic function. Small pericardial effusion.    reviewed  Micro Data:  BAL 5/21 >> Rare WBC, no organisms seen  Blood cultures 5/20 > NGTD COVID-19 5/20: Negative Brushings cytopathology 5/21> no malignant cells seen   Antimicrobials:  azithromycin 5/21>>>5/25 Ceftriaxone 5/21>>>  Interim history/subjective:  No distress on pressure support ventilation however tidal volumes borderline with mild accessory use on 5 cm water  Objective   Blood pressure (Abnormal) 152/79, pulse (Abnormal) 110, temperature 100.2 F (37.9 C), temperature source Oral, resp. rate (Abnormal) 22, height _0  (1.88 m), weight 108.9 kg, SpO2 92 %.    Vent Mode: CPAP;PSV FiO2 (%):  [40 %] 40 % Set Rate:  [18 bmp] 18 bmp Vt Set:  [650 mL] 650 mL PEEP:  [5 cmH20] 5 cmH20 Pressure Support:  [5 cmH20] 5 cmH20 Plateau Pressure:  [8 cmH20-28 cmH20] 8 cmH20   Intake/Output Summary (Last 24 hours) at 11/23/2018  9147 Last data filed at 11/23/2018 0800 Gross per 24 hour  Intake  1966.3 ml  Output 1900 ml  Net 66.3 ml   Filed Weights   11/21/18 0150 11/22/18 0500 11/23/18 0145  Weight: 110.4 kg 108 kg 108.9 kg   Vent Mode: CPAP;PSV FiO2 (%):  [40 %] 40 % Set Rate:  [18 bmp] 18 bmp Vt Set:  [650 mL] 650 mL PEEP:  [5 cmH20] 5 cmH20 Pressure Support:  [5 cmH20] 5 cmH20 Plateau Pressure:  [8 WGN56-21 cmH20] 8 cmH20  Examination:  General  this is a disheveled 78 year old chronically ill appearing male, he remains on ventilator support however he is cycling on pressure support ventilation HEENT normocephalic atraumatic no jugular venous distention orally intubated pupils nonicteric Pulmonary: Equal chest rise on mechanically assisted ventilation.  Tidal volume barely 300 on pressure support of 5.  Remarkably diminished on the right side.  Mild accessory use until pressure support titrated up to 10 cmH2O Cardiac: Tachycardic rhythm no murmur rub or gallop Abdomen: Soft nontender Extremities: Warm and dry brisk capillary refill Neuro: Awake, interactive, no focal deficits appreciated.  Resolved Hospital Problem list     Assessment & Plan:   Acute respiratory failure with hypoxia and hypercarbia -in setting of R sided atelectasis concern about chronic scarring of the right upper lobe  -, Status post bronchoscopy 5/21 copious mucus plugs  -BAL on 5/21 shows no organisms, brushing cytopath shows no malignant cells  chest x-ray personally reviewed:Endotracheal tube above carina but directed towards right mainstem.  There is marked obstruction of the right upper lobe, this is not improved much when comparing prior film, left side aeration improved to some Plan Continuing pressure support as tolerated  Completes azithromycin today, 2 more days of ceftriaxone Continue hypertonic nebulizers Continue scheduled bronchodilators Completing Mucomyst today Chest PT  Acute diastolic heart failure  Paroxysmal atrial fibrillation  -Echocardiogram shows normal EF,  decreased RV SF, small pericardial effusion -Home med: Tenormin - TFTs on 5/23   Cardiology consulted 5/23 for A fib, appreciate recs  Plan Continuing amiodarone Continue heparin drip Continue telemetry monitoring  Acute metabolic encephalopathy in setting of hypoxia, possible infection Plan RASS goal 0  RN Delirium precautions Precedex gtt   History of non-small cell lung cancer Possible recurrence of metastatic disease Cytology report: no malgnant cells  Plan F/u as out-pt   AKI on CKD 3  -Creatinine stable Plan Holding NSAIDs given poor baseline renal function  Hold Lasix today given rising sodium A.m. chemistry Avoid hypotension  Fluid and electrolyte imbalance: Hypernatremia, hyperchloremia Plan Adding free water Holding Lasix today Change bicarbonate to twice daily A.m. chemistry  Protein calorie malnutrition Plan Continue tube feeds  DM2 with acute hyperglycemia, he remains quite hyperglycemic Plan New Lantus 30 units every day Continue sliding scale insulin Change standing aspart to 10 units every 4   Best practice:  Diet: EN Pain/Anxiety/Delirium protocol (if indicated): Precedex, PRN fent VAP protocol (if indicated): Bundle in place DVT prophylaxis: Unfractionated heparin GI prophylaxis: Famotidine Glucose control: Lantus, SSI  Mobility: Bedrest Code Status: Partial (no CPR, No CV)  Family Communication: Pending  Disposition: ICU He remains critically ill.  We are currently titrating ventilatory support.  Pulmonary hygiene with right upper lobe mucous plugging is his biggest barrier to extubation.  We will add Meta neb today, his cytology was negative.  He may need repeat bronchoscopy however I am doubtful this will actually recruit him.  For now we will try noninvasive approach.  My cct 31 min  Erick Colace ACNP-BC [place] Pager # [phone] OR # [phone] if no answer

## 2018-11-23 NOTE — Progress Notes (Addendum)
78 year old with COPD and a history of lung cancer treated with chemoradiation in 2016, and presumed remission with chronic right upper lobe atelectasis/scarring on prior imaging.  Admitted 5/[facility] from [facility] with acute hypercarbic respiratory failure requiring mechanical ventilation Bronchoscopy performed on 5/21 and thick inspissated mucus removed from right middle and lower lobe bronchi with improved aeration  Brushings were suspicious for malignancy.  Since then he has tolerated some level of weaning, low tidal volumes today on pressure support 5/5. Course complicated by transient A. fib RVR now back in sinus tach  On exam-follows one-step commands, minimal sedation, nonfocal moves all 4 extremities, decreased breath sounds on right, S1-S2 tacky, soft and nontender abdomen.  Labs show increasing sodium to 154, stable creatinine 2.8 Over all negative balance 6 L.  X-ray 5/25 personally reviewed which shows improved aeration right lower lobe and chronic atelectasis/consolidation right upper lobe.  Impression/plan Acute hypercarbic respiratory failure with underlying COPD Improved right atelectasis post bronchoscopy  -Continue empiric antibiotics -Pulmonary toilet with hypertonic saline -Continue Pulmicort and Brovana -Spontaneous breathing trials, aim for one-way extubation on 5/26  History of lung cancer treated with chemoradiation chronic right upper lobe atelectasis-bronchial brushings were suspicious for malignancy.  If he survives this episode he will need PET scan as outpatient  Paroxysmal atrial fibrillation -continue oral amiodarone 200 twice daily and metoprolol 25 twice daily, will need long-term anticoagulationq  AKI on CKD stage III-hold Lasix for now Hypernatremia -add free water  Updated son Annie Main, plan for one-way extubation 5/26 if he meets criteria and is otherwise optimized  The patient is critically ill with multiple organ systems failure and requires high  complexity decision making for assessment and support, frequent evaluation and titration of therapies, application of advanced monitoring technologies and extensive interpretation of multiple databases. Critical Care Time devoted to patient care services described in this note independent of APP/resident  time is 35 minutes.   [name] Elsworth Soho MD

## 2018-11-23 NOTE — Progress Notes (Signed)
Assisted tele visit to patient with son.  Vear Clock, RN

## 2018-11-23 NOTE — Progress Notes (Signed)
Spoke w/ pts son to provide updates. Pts son expressed concern that PCP diagnosed pt w/ CHF 6 mo. Ago but never referred pt to cardiologist.  Stated I will speak w/ CM abt this.

## 2018-11-23 NOTE — Progress Notes (Signed)
eLink Physician-Brief Progress Note Patient Name: Edward Hahn DOB: 1941/05/30 MRN: [id number]   Date of Service  11/23/2018  HPI/Events of Note  Pain - Fentanyl 25 mcg IV Q 2 hours not relieving pain.   eICU Interventions  Will order: 1. Increase Fentanyl to 25-50 mcg IV Q 1 hour PRN pain.      Intervention Category Intermediate Interventions: Pain - evaluation and management  Sommer,Steven Eugene 11/23/2018, 2:58 AM

## 2018-11-24 ENCOUNTER — Inpatient Hospital Stay (HOSPITAL_COMMUNITY): Payer: Medicare Other

## 2018-11-24 DIAGNOSIS — E870 Hyperosmolality and hypernatremia: Secondary | ICD-10-CM

## 2018-11-24 LAB — BASIC METABOLIC PANEL
Anion gap: 12 (ref 5–15)
Anion gap: 10 (ref 5–15)
BUN: 79 mg/dL — ABNORMAL HIGH (ref 8–23)
BUN: 79 mg/dL — ABNORMAL HIGH (ref 8–23)
CO2: 29 mmol/L (ref 22–32)
CO2: 29 mmol/L (ref 22–32)
Calcium: 9.3 mg/dL (ref 8.9–10.3)
Calcium: 9.3 mg/dL (ref 8.9–10.3)
Chloride: 115 mmol/L — ABNORMAL HIGH (ref 98–111)
Chloride: 117 mmol/L — ABNORMAL HIGH (ref 98–111)
Creatinine, Ser: 2.54 mg/dL — ABNORMAL HIGH (ref 0.61–1.24)
Creatinine, Ser: 2.5 mg/dL — ABNORMAL HIGH (ref 0.61–1.24)
GFR calc Af Amer: 27 mL/min — ABNORMAL LOW (ref >60)
GFR calc Af Amer: 28 mL/min — ABNORMAL LOW (ref >60)
GFR calc non Af Amer: 23 mL/min — ABNORMAL LOW (ref >60)
GFR calc non Af Amer: 24 mL/min — ABNORMAL LOW (ref >60)
Glucose, Bld: 220 mg/dL — ABNORMAL HIGH (ref 70–99)
Glucose, Bld: 325 mg/dL — ABNORMAL HIGH (ref 70–99)
Potassium: 3.8 mmol/L (ref 3.5–5.1)
Potassium: 3.9 mmol/L (ref 3.5–5.1)
Sodium: 156 mmol/L — ABNORMAL HIGH (ref 135–145)
Sodium: 156 mmol/L — ABNORMAL HIGH (ref 135–145)

## 2018-11-24 LAB — CBC
HCT: 36.5 % — ABNORMAL LOW (ref 39.0–52.0)
Hemoglobin: 10 g/dL — ABNORMAL LOW (ref 13.0–17.0)
MCH: 27.2 pg (ref 26.0–34.0)
MCHC: 27.4 g/dL — ABNORMAL LOW (ref 30.0–36.0)
MCV: 99.2 fL (ref 80.0–100.0)
Platelets: 143 10*3/uL — ABNORMAL LOW (ref 150–400)
RBC: 3.68 MIL/uL — ABNORMAL LOW (ref 4.22–5.81)
RDW: 16.8 % — ABNORMAL HIGH (ref 11.5–15.5)
WBC: 3.6 10*3/uL — ABNORMAL LOW (ref 4.0–10.5)
nRBC: 0 % (ref 0.0–0.2)

## 2018-11-24 LAB — GLUCOSE, CAPILLARY
Glucose-Capillary: 165 mg/dL — ABNORMAL HIGH (ref 70–99)
Glucose-Capillary: 168 mg/dL — ABNORMAL HIGH (ref 70–99)
Glucose-Capillary: 226 mg/dL — ABNORMAL HIGH (ref 70–99)
Glucose-Capillary: 237 mg/dL — ABNORMAL HIGH (ref 70–99)
Glucose-Capillary: 220 mg/dL — ABNORMAL HIGH (ref 70–99)

## 2018-11-24 LAB — HEPARIN LEVEL (UNFRACTIONATED)
Heparin Unfractionated: 0.24 IU/mL — ABNORMAL LOW (ref 0.30–0.70)
Heparin Unfractionated: 0.43 IU/mL (ref 0.30–0.70)

## 2018-11-24 MED ORDER — INSULIN ASPART 100 UNIT/ML ~~LOC~~ SOLN
14.0000 [IU] | SUBCUTANEOUS | Status: DC
  Administered 2018-11-24 – 2018-11-25 (×6): 14 [IU] via SUBCUTANEOUS

## 2018-11-24 MED ORDER — DEXTROSE 5 % IV SOLN
INTRAVENOUS | Status: DC
  Administered 2018-11-24 – 2018-11-26 (×6): via INTRAVENOUS

## 2018-11-24 MED ORDER — LEVALBUTEROL HCL 0.63 MG/3ML IN NEBU
0.6300 mg | INHALATION_SOLUTION | Freq: Four times a day (QID) | RESPIRATORY_TRACT | Status: DC
  Administered 2018-11-24 – 2018-11-26 (×8): 0.63 mg via RESPIRATORY_TRACT
  Filled 2018-11-24 (×7): qty 3

## 2018-11-24 MED ORDER — LEVALBUTEROL HCL 0.63 MG/3ML IN NEBU
INHALATION_SOLUTION | RESPIRATORY_TRACT | Status: AC
  Filled 2018-11-24: qty 3

## 2018-11-24 MED ORDER — SODIUM CHLORIDE 3 % IN NEBU
4.0000 mL | INHALATION_SOLUTION | Freq: Two times a day (BID) | RESPIRATORY_TRACT | Status: DC
  Administered 2018-11-24 – 2018-11-25 (×3): 4 mL via RESPIRATORY_TRACT
  Filled 2018-11-24 (×4): qty 4

## 2018-11-24 MED ORDER — DEXMEDETOMIDINE HCL IN NACL 400 MCG/100ML IV SOLN
0.4000 ug/kg/h | INTRAVENOUS | Status: DC
  Administered 2018-11-24: 19:00:00 0.4 ug/kg/h via INTRAVENOUS
  Administered 2018-11-24: 09:00:00 0.6 ug/kg/h via INTRAVENOUS
  Administered 2018-11-24 – 2018-11-25 (×2): 0.4 ug/kg/h via INTRAVENOUS
  Filled 2018-11-24 (×4): qty 100

## 2018-11-24 MED ORDER — METOPROLOL TARTRATE 25 MG/10 ML ORAL SUSPENSION
25.0000 mg | Freq: Two times a day (BID) | ORAL | Status: DC
  Administered 2018-11-25: 25 mg
  Filled 2018-11-24: qty 10

## 2018-11-24 NOTE — Progress Notes (Signed)
Pt continuously reaching for ETT tube and lines. Attempted to reorient, calm, and manage with medication. Extremely agitated. Called for restraints for safety.  Weldon Inches, RN

## 2018-11-24 NOTE — Progress Notes (Signed)
78 year old with COPD and a history of lung cancer treated with chemoradiation in 2016, and presumed remission with chronic right upper lobe atelectasis/scarring on prior imaging.Admitted 5/20 from [facility] with acute hypercarbic respiratory failure requiring mechanical ventilation Bronchoscopy performed on 5/21 and thick inspissated mucus removed from right middle and lower lobe bronchi with improved aeration  Brushings were suspicious for malignancy  He had some agitation overnight requiring initiation of Precedex.  Failed spontaneous breathing trial this morning. On exam-mildly sedated, RA SS -2, increased rhonchi on right with transmitted bronchial breathing, clear on left, S1-S2 tacky, soft and nontender abdomen, 1+ edema.  Chest x-ray 5/26 personally reviewed which shows increased atelectasis on the right with decreased aeration right lower lobe Labs show rising sodium to 156, decreasing creatinine to 2.5, stable anemia and mild leukopenia  Pression/plan Acute respiratory failure, per car backslash underlying COPD Right lower lobe atelectasis which had improved now appears worse again  -Continue pulmonary toilet, re-add hypertonic saline nebs, continue Pulmicort and Brovana -Plan was for one-way extubation but will have to rediscuss with family and clarify goals of care -BAL cultures were negative but will continue ceftriaxone for 7 to 10 days with based on progress -Position right side up  History of lung cancer treated with chemoradiation chronic right upper lobe atelectasis-bronchial brushings were suspicious for malignancy.  If he survives this episode he will need PET scan as outpatient due to new right hilar lymphadenopathy  Paroxysmal atrial fibrillation -continue oral amiodarone 200 twice daily and metoprolol 25 twice daily, continue IV heparin  AKI on CKD stage III-hold Lasix for now, creatinine decreasing Hypernatremia -add 5W at 75/hour  Hyperglycemia/diabetes type  2-expect this to get worse with D5 drip, increase Lantus to 30 units and SSI resistant scale  The patient is critically ill with multiple organ systems failure and requires high complexity decision making for assessment and support, frequent evaluation and titration of therapies, application of advanced monitoring technologies and extensive interpretation of multiple databases. Critical Care Time devoted to patient care services described in this note independent of APP/resident  time is 35 minutes.   [name] Elsworth Soho MD

## 2018-11-24 NOTE — Progress Notes (Signed)
NAME:  Edward Hahn, MRN:  [id number], DOB:  31-Jan-1941, LOS: 6 ADMISSION DATE:  11/11/2018, CONSULTATION DATE:  11/24/2018 REFERRING MD:  ED [facility], CHIEF COMPLAINT: Respiratory failure  Brief History   Patient unable to communicate, history from records from [facility].  78 year old man presented with increased shortness of breath and generalized weakness.  Found to have hypercarbic respiratory failure.  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 Coronary artery disease and known congestive heart failure and recent admission for non-ST elevation MI. Significant Hospital Events   Transferred to [facility] 5/20 after presenting to outside hospital with right lung opacification, and respiratory failure 5/21: Bronchoscopy performed at bedside for diagnostic evaluation of the airway.  "Thick inspissated mucus seen in right mainstem bronchus.  Took multiple passes in multiple lavages to suction this out in pieces.  Mucous plugs suctioned out.  Bronchi could be better visualized now.  Right upper lobe bronchus appeared to be distally blocked?  Old scarring brushings obtained from this area.  Right lower lobe bronchi appeared open once mucus suctioned".  Remains sedated for intermittent agitation.  Started ceftriaxone and azithromycin for possible community acquired pneumonia, diuresis initiated for possible element of diastolic dysfunction.  Also concerned about possible recurrence of non-small cell lung cancer 5/22: Wound ostomy nurse consulted for traumatic injury to left foot toes #2 and 3 felt secondary to possiby pressure from  shoes.  Also had moisture injury on coccyx 5/23 cardiology consulted for new A fib >  NSR same day. Started heparin gtt for anticoagulation  5/25: Weaning.  Looking better.  Still with significant right upper lobe collapse.  Adding Meta neb. 5/26: More agitated during the evening hours, no fever spike, no change in white blood cell count, endotracheal tube had to be repositioned, was advanced a bit too far.  Chest x-ray comparing prior film showed worsening significantly on the right, repeating sputum culture, adding hypertonic nebulizers  Consults:  PCCM Cardiology  Procedures:  CTc hest 5/20 >> right hilar lymphadenopathy, completely collapsed/consolidated right lung bronchoscopy  5/21 copious thick secretions/  brushings obtained from right upper lobe  Significant Diagnostic Tests:  CXR 5/20 - Right lung opacification. ETT in position. (personally reviewed) - unchanged according to reports from [facility]. Echocardiogram 5/20: Ejection fraction 50 to 55% with cavity mildly dilated.  Doppler parameters consistent with impaired relaxation, there was mildly reduced right ventricular systolic function and the cavity was mildly enlarged there was small circumferential pericardial effusion  ECHO 5/20> LVEF 50-55%. Moderately reduced systolic function. Small pericardial effusion.    reviewed  Micro Data:  BAL 5/21 >> Rare WBC, no organisms seen  Blood cultures 5/20 > NGTD COVID-19 5/20: Negative Brushings cytopathology 5/21> no malignant cells seen, however suspicious  Antimicrobials:  azithromycin 5/21>>>5/25 Ceftriaxone 5/21>>>  Interim history/subjective:  Agitated last night, Precedex increased  Objective   Blood pressure (Abnormal) 156/92, pulse 65, temperature 98.8 F (37.1 C), temperature source Oral, resp. rate 10, height _0  (1.88 m), weight 109.1 kg, SpO2 93 %.    Vent Mode: PRVC FiO2 (%):  [40 %] 40 % Set Rate:  [18 bmp] 18 bmp Vt Set:  [  650 mL] 650 mL PEEP:  [5  cmH20] 5 cmH20 Pressure Support:  [10 cmH20] 10 cmH20 Plateau Pressure:  [18 cmH20-26 cmH20] 24 cmH20   Intake/Output Summary (Last 24 hours) at 11/24/2018 0818 Last data filed at 11/24/2018 0600 Gross per 24 hour  Intake 3692.49 ml  Output 1490 ml  Net 2202.49 ml   Filed Weights   11/22/18 0500 11/23/18 0145 11/24/18 0443  Weight: 108 kg 108.9 kg 109.1 kg   Vent Mode: PRVC FiO2 (%):  [40 %] 40 % Set Rate:  [18 bmp] 18 bmp Vt Set:  [650 mL] 650 mL PEEP:  [5 cmH20] 5 cmH20 Pressure Support:  [10 cmH20] 10 cmH20 Plateau Pressure:  [18 cmH20-26 cmH20] 24 cmH20  Examination:  General: This is a disheveled 78 year old white male is currently sedated on a Precedex infusion HEENT normocephalic atraumatic no jugular venous distention he is orally intubated Pulmonary: Wheezing more on the right today, diffuse rhonchi, equal chest rise on exam.  Failed spontaneous breathing trial this morning due to no effort Cardiac: Regular rate and rhythm without murmur rub or gallop Abdomen: Soft nontender no organomegaly, continues to have liquid type stools GU: Clear yellow Neuro: Currently sedated Extremities: Warm and dry, dependent edema is appreciated.  Resolved Hospital Problem list     Assessment & Plan:   Acute respiratory failure with hypoxia and hypercarbia -in setting of R sided atelectasis/postobstructive pneumonia. -Status post bronchoscopy 5/21 copious mucus plugs, cytology "suspicious for malignancy" Portable chest x-ray personally reviewed: Endotracheal tube needs to be pulled back 2 cm.  There is now progression once again of right-sided airspace disease with opacification involving the upper lobe, lower lobe, and decreased overall aeration.  Also worsening aeration on the left -No significant pulmonary secretions, no fever spikes Plan Day #6 of 7 ceftriaxone, and has completed azithromycin  Retract endotracheal tube 2 centimeters Pressure support as tolerated, will reach out  to family again, we need to work towards a one-way extubation however given chest x-ray progression I worry about potential VAP; increase PEEP to 8 Repeat sputum culture Continue scheduled bronchodilators Continue hypertonic saline nebs A.m. chest x-ray  Acute diastolic heart failure  Paroxysmal atrial fibrillation  -Echocardiogram shows normal EF, decreased RV SF, small pericardial effusion -Home med: Tenormin Cardiology consulted 5/23 for A fib, appreciate recs  Plan Continue amiodarone and heparin infusion  Telemetry monitoring   Acute metabolic encephalopathy in setting of hypoxia, possible infection Plan RA SS goal 0  Titrate Precedex  Delirium precautions   History of non-small cell lung cancer Possible recurrence of metastatic disease Cytology report: Nondiagnostic however "suspicious for malignancy" Plan PET scan if survives hospitalization  AKI on CKD 3  -Creatinine stable Plan No NSAIDs given poor baseline renal function  Holding Lasix today as sodium continues to rise  A.m. chemistry  Avoid hypotension if able   Fluid and electrolyte imbalance: Hypernatremia, hyperchloremia Added free water via tube yesterday, sodium still climbing, currently has water deficit calculates to: ~ 6.2 liters Plan Continue current free water replacement via tube We will add D5 water via infusion at 75 mL an hour A.m. chemistry Will discontinue bicarb supplementation  Protein calorie malnutrition Plan Tube feeds  DM2 with acute hyperglycemia, he remains quite hyperglycemic, however this is improved some with current adjustments Plan Continue Lantus at 30 units daily Continue sliding scale insulin  Aspart 14 units every 4 hours     Best practice:  Diet: EN Pain/Anxiety/Delirium protocol (if indicated): Precedex, PRN fent VAP protocol (  if indicated): Bundle in place DVT prophylaxis: Unfractionated heparin GI prophylaxis: Famotidine Glucose control: Lantus, SSI   Mobility: Bedrest Code Status: Partial (no CPR, No CV)  Family Communication: Pending  Disposition: ICU He remains critically ill due to ventilator dependence and need for titration of PEEP/FiO2 in the setting of what appears to be a postobstructive pneumonia.  His chest x-ray looks worse today, he was more agitated last night requiring titration up of Precedex infusion.  I am a little concerned about the possibility of a ventilator associated process however have not seen new fever spike or change in white blood cell count.  For today we will add Meta neb, this got delayed.  Also add hypertonic saline, this fell off his MAR, recheck sputum culture, and titrate PEEP up to 8.  I do not think he is ready for extubation today, we are working towards a goal of a one-way extubation.  Should he survive this this overall he will need a repeat PET scan  My cct 35  min  Erick Colace ACNP-BC [place] Pager # [phone] OR # [phone] if no answer

## 2018-11-24 NOTE — Progress Notes (Addendum)
[place] for heparin Indication: atrial fibrillation  Patient Measurements: Height: 6\' 2"  (188 cm) Weight: 240 lb 8.4 oz (109.1 kg) IBW/kg (Calculated) : 82.2 Heparin Dosing Weight: 100kg  Vital Signs: Temp: 98.9 F (37.2 C) (05/26 0444) Temp Source: Oral (05/26 0444) BP: 156/92 (05/26 0600) Pulse Rate: 65 (05/26 0600)  Labs: Recent Labs    11/22/18 0648 11/22/18 0936  11/23/18 0334 11/23/18 1549 11/24/18 0529  HGB 10.3* 10.6*  --  10.3*  --  10.0*  HCT 35.7* 37.2*  --  37.1*  --  36.5*  PLT 147* 144*  --  142*  --  143*  HEPARINUNFRC  --   --    < > 0.28* 0.37 0.24*  CREATININE 2.83*  --   --  2.82*  --  2.54*   < > = values in this interval not displayed.     Assessment: 70 yoM with new onset AFib started on heparin infusion. CBC stable  Heparin level below goal at 0.24 units/mL, no bleeding or IV issues noted.   Goal of Therapy:  Heparin level 0.3-0.7 units/ml Monitor platelets by anticoagulation protocol: Yes   Plan:  -Increase heparin IV infusion to 1900 units/hr -Check 8 hr level  -Daily heparin level/CBC  Harrietta Guardian, PharmD PGY1 Pharmacy Resident 11/24/2018    8:38 AM Please check AMION for all [place] numbers   ADDENDUM: Repeat heparin level therapeutic at 0.43 units/ml. Continue heparin at current rate.   Harrietta Guardian, PharmD PGY1 Pharmacy Resident 11/24/2018    3:20 PM Please check AMION for all [place] numbers

## 2018-11-24 NOTE — Progress Notes (Signed)
Spoke w/ both pts son Richardson Landry as well as granddaughter to provide updates.

## 2018-11-24 NOTE — Progress Notes (Signed)
eLink Physician-Brief Progress Note Patient Name: Edward Hahn DOB: 08/08/1940 MRN: [id number]   Date of Service  11/24/2018  HPI/Events of Note  Agitation - Request for bilateral soft wrist restraints.   eICU Interventions  Will order: 1. Bilateral soft wrist restraints.  2. Precedex IV infusion. Titrate to RASS = 0 to -1.      Intervention Category Major Interventions: Delirium, psychosis, severe agitation - evaluation and management  Detrice Cales [place] 11/24/2018, 4:52 AM

## 2018-11-24 NOTE — Progress Notes (Signed)
eLink Physician-Brief Progress Note Patient Name: Edward Hahn DOB: 1941-01-21 MRN: [id number]   Date of Service  11/24/2018  HPI/Events of Note  Agitation - Request to reorder bilateral soft wrist restraints which have expired.   eICU Interventions  Will renew bilateral soft wrist restraint order X 12 hours. This order will need to be renewed for 24 hours by the day rounding team in the morning. Restraint orders need to be done with face-to-face interaction physician-patient interaction and should not be done by [place].      Intervention Category Major Interventions: Other:  Lysle Dingwall 11/24/2018, 7:51 PM

## 2018-11-25 ENCOUNTER — Inpatient Hospital Stay (HOSPITAL_COMMUNITY): Payer: Medicare Other

## 2018-11-25 LAB — COMPREHENSIVE METABOLIC PANEL
ALT: 21 U/L (ref 0–44)
AST: 19 U/L (ref 15–41)
Albumin: 1.9 g/dL — ABNORMAL LOW (ref 3.5–5.0)
Alkaline Phosphatase: 100 U/L (ref 38–126)
Anion gap: 12 (ref 5–15)
BUN: 78 mg/dL — ABNORMAL HIGH (ref 8–23)
CO2: 28 mmol/L (ref 22–32)
Calcium: 9.1 mg/dL (ref 8.9–10.3)
Chloride: 113 mmol/L — ABNORMAL HIGH (ref 98–111)
Creatinine, Ser: 2.37 mg/dL — ABNORMAL HIGH (ref 0.61–1.24)
GFR calc Af Amer: 30 mL/min — ABNORMAL LOW (ref >60)
GFR calc non Af Amer: 25 mL/min — ABNORMAL LOW (ref >60)
Glucose, Bld: 251 mg/dL — ABNORMAL HIGH (ref 70–99)
Potassium: 4.1 mmol/L (ref 3.5–5.1)
Sodium: 153 mmol/L — ABNORMAL HIGH (ref 135–145)
Total Bilirubin: 0.3 mg/dL (ref 0.3–1.2)
Total Protein: 6.1 g/dL — ABNORMAL LOW (ref 6.5–8.1)

## 2018-11-25 LAB — CBC
HCT: 36.2 % — ABNORMAL LOW (ref 39.0–52.0)
Hemoglobin: 9.9 g/dL — ABNORMAL LOW (ref 13.0–17.0)
MCH: 27.2 pg (ref 26.0–34.0)
MCHC: 27.3 g/dL — ABNORMAL LOW (ref 30.0–36.0)
MCV: 99.5 fL (ref 80.0–100.0)
Platelets: 113 10*3/uL — ABNORMAL LOW (ref 150–400)
RBC: 3.64 MIL/uL — ABNORMAL LOW (ref 4.22–5.81)
RDW: 16.7 % — ABNORMAL HIGH (ref 11.5–15.5)
WBC: 3.3 10*3/uL — ABNORMAL LOW (ref 4.0–10.5)
nRBC: 0 % (ref 0.0–0.2)

## 2018-11-25 LAB — GLUCOSE, CAPILLARY
Glucose-Capillary: 122 mg/dL — ABNORMAL HIGH (ref 70–99)
Glucose-Capillary: 132 mg/dL — ABNORMAL HIGH (ref 70–99)
Glucose-Capillary: 139 mg/dL — ABNORMAL HIGH (ref 70–99)
Glucose-Capillary: 189 mg/dL — ABNORMAL HIGH (ref 70–99)
Glucose-Capillary: 190 mg/dL — ABNORMAL HIGH (ref 70–99)
Glucose-Capillary: 210 mg/dL — ABNORMAL HIGH (ref 70–99)
Glucose-Capillary: 228 mg/dL — ABNORMAL HIGH (ref 70–99)
Glucose-Capillary: 44 mg/dL — CL (ref 70–99)
Glucose-Capillary: 53 mg/dL — ABNORMAL LOW (ref 70–99)
Glucose-Capillary: 72 mg/dL (ref 70–99)

## 2018-11-25 LAB — HEPARIN LEVEL (UNFRACTIONATED): Heparin Unfractionated: 0.42 IU/mL (ref 0.30–0.70)

## 2018-11-25 MED ORDER — METOPROLOL TARTRATE 25 MG PO TABS
25.0000 mg | ORAL_TABLET | Freq: Two times a day (BID) | ORAL | Status: DC
  Administered 2018-11-25: 25 mg via ORAL
  Filled 2018-11-25: qty 1

## 2018-11-25 MED ORDER — AMIODARONE HCL 200 MG PO TABS
200.0000 mg | ORAL_TABLET | Freq: Two times a day (BID) | ORAL | Status: DC
  Administered 2018-11-25: 23:00:00 200 mg via ORAL
  Filled 2018-11-25: qty 1

## 2018-11-25 MED ORDER — ORAL CARE MOUTH RINSE: 15.0000 mL | Freq: Two times a day (BID) | OROMUCOSAL | Status: DC

## 2018-11-25 MED ORDER — ALBUTEROL SULFATE (2.5 MG/3ML) 0.083% IN NEBU
2.5000 mg | INHALATION_SOLUTION | RESPIRATORY_TRACT | Status: DC | PRN
  Administered 2018-11-25 (×2): 2.5 mg via RESPIRATORY_TRACT
  Filled 2018-11-25 (×2): qty 3

## 2018-11-25 MED ORDER — INSULIN ASPART 100 UNIT/ML ~~LOC~~ SOLN
16.0000 [IU] | SUBCUTANEOUS | Status: DC
  Administered 2018-11-25: 13:00:00 16 [IU] via SUBCUTANEOUS

## 2018-11-25 MED ORDER — DEXTROSE 50 % IV SOLN
INTRAVENOUS | Status: AC
  Administered 2018-11-25: 17:00:00 50 mL
  Filled 2018-11-25: qty 50

## 2018-11-25 MED ORDER — CHLORHEXIDINE GLUCONATE 0.12 % MT SOLN
15.0000 mL | Freq: Two times a day (BID) | OROMUCOSAL | Status: DC
  Administered 2018-11-25 – 2018-11-26 (×2): 15 mL via OROMUCOSAL
  Filled 2018-11-25: qty 15

## 2018-11-25 NOTE — Procedures (Addendum)
Extubation Procedure Note  Patient Details:   Name: Edward Hahn DOB: 02-26-1941 MRN: [id number]   Airway Documentation:    Vent end date: 11/25/18 Vent end time: 1103   Evaluation  O2 sats: stable throughout Complications: No apparent complications Patient did tolerate procedure well. Bilateral Breath Sounds: Diminished, Rhonchi   Yes   Patient was extubated to a 4L [place] from 12/8, PS/CPAP MD was ok with these settings. Cuff leak was heard. No stridor was noted. Patient tolerated well but did require going up to 6L before RT left the room RN was at bedside with RT during extubation. RT will monitor.  Renato Gails Shanikka Wonders 11/25/2018, 11:11 AM

## 2018-11-25 NOTE — Progress Notes (Signed)
I have updated the patient's son Edward Hahn and his wife Anderson Malta via phone.  Mr. Kelley looks the best he has looked in several days.  I think he is ready for extubation.  We have clarified that he is not a candidate for reintubation, and that he would not want prolonged ventilatory support trach or PEG. Plan Extubate today Continue medical management I prepared the son and his spouse that should he struggle post extubation we may need to transition to comfort, I have assured them I will notify them if this appears to be the case Full DNR/DNI  Erick Colace ACNP-BC [place] Pager # [phone] OR # [phone] if no answer

## 2018-11-25 NOTE — Progress Notes (Signed)
eLink Physician-Brief Progress Note Patient Name: Edward Hahn DOB: Mar 18, 1941 MRN: [id number]   Date of Service  11/25/2018  HPI/Events of Note  Notified of hypoglycemic episodes earlier in the day and patient failing bedside swallow.  He received Lantus 30 units at 10:50 am and scheduled Novolog 16 units 12:52 pm. Most recent glucose 139.  eICU Interventions  Will hold scheduled Novolog while patient is still NPO and place on sliding scale insulin. Bedside team to reassess regarding basal insulin in am. Patient may take PO meds tonight with sips of thickened fluid or apple sauce.     Intervention Category Intermediate Interventions: Hyperglycemia - evaluation and treatment  Shona Needles Santia Labate 11/25/2018, 8:13 PM

## 2018-11-25 NOTE — Progress Notes (Signed)
[place] for heparin Indication: atrial fibrillation  Patient Measurements: Height: 6\' 2"  (188 cm) Weight: 243 lb 6.2 oz (110.4 kg) IBW/kg (Calculated) : 82.2 Heparin Dosing Weight: 100kg  Vital Signs: Temp: 98.4 F (36.9 C) (05/27 0416) Temp Source: Oral (05/27 0416) BP: 127/62 (05/27 0735) Pulse Rate: 79 (05/27 0735)  Labs: Recent Labs    11/23/18 0334  11/24/18 0529 11/24/18 1415 11/25/18 0354  HGB 10.3*  --  10.0*  --  9.9*  HCT 37.1*  --  36.5*  --  36.2*  PLT 142*  --  143*  --  113*  HEPARINUNFRC 0.28*   < > 0.24* 0.43 0.42  CREATININE 2.82*  --  2.54* 2.50* 2.37*   < > = values in this interval not displayed.     Assessment: 66 yoM with new onset AFib started on heparin infusion. CBC stable  Heparin level therapeutic at 0.42 units/mL, no bleeding or IV issues noted.   Goal of Therapy:  Heparin level 0.3-0.7 units/ml Monitor platelets by anticoagulation protocol: Yes   Plan:  -Continue heparin IV infusion 1900 units/hr -Daily heparin level/CBC, monitor for s/s bleeding  Harrietta Guardian, PharmD PGY1 Pharmacy Resident 11/25/2018    8:26 AM Please check AMION for all [place] numbers

## 2018-11-25 NOTE — Progress Notes (Signed)
NAME:  Edward Hahn, MRN:  [id number], DOB:  1940/08/12, LOS: 7 ADMISSION DATE:  11/05/2018, CONSULTATION DATE:  11/16/2018 REFERRING MD:  ED [facility], CHIEF COMPLAINT: Respiratory failure  Brief History   Patient unable to communicate, history from records from [facility].  78 year old man presented with increased shortness of breath and generalized weakness.  Found to have hypercarbic respiratory failure.  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 Coronary artery disease and known congestive heart failure and recent admission for non-ST elevation MI. Significant Hospital Events   Transferred to [facility] 5/20 after presenting to outside hospital with right lung opacification, and respiratory failure 5/21: Bronchoscopy performed at bedside for diagnostic evaluation of the airway.  "Thick inspissated mucus seen in right mainstem bronchus.  Took multiple passes in multiple lavages to suction this out in pieces.  Mucous plugs suctioned out.  Bronchi could be better visualized now.  Right upper lobe bronchus appeared to be distally blocked?  Old scarring brushings obtained from this area.  Right lower lobe bronchi appeared open once mucus suctioned".  Remains sedated for intermittent agitation.  Started ceftriaxone and azithromycin for possible community acquired pneumonia, diuresis initiated for possible element of diastolic dysfunction.  Also concerned about possible recurrence of non-small cell lung cancer 5/22: Wound ostomy nurse consulted for traumatic injury to left foot toes #2 and 3 felt secondary to possiby pressure from  shoes.  Also had moisture injury on coccyx 5/23 cardiology consulted for new A fib >  NSR same day. Started heparin gtt for anticoagulation  5/25: Weaning.  Looking better.  Still with significant right upper lobe collapse.  Adding Meta neb. 5/26: More agitated during the evening hours, no fever spike, no change in white blood cell count, endotracheal tube had to be repositioned, was advanced a bit too far.  Chest x-ray comparing prior film showed worsening significantly on the right, repeating sputum culture, adding hypertonic nebulizers 5/27: Chest x-ray improved, cycling on pressure support ventilation.  Working towards one-way extubation  Consults:  PCCM Cardiology  Procedures:  [place] 5/20 >> right hilar lymphadenopathy, completely collapsed/consolidated right lung bronchoscopy  5/21 copious thick secretions/  brushings obtained from right upper lobe  Significant Diagnostic Tests:  CXR 5/20 - Right lung opacification. ETT in position. (personally reviewed) - unchanged according to reports from [facility]. Echocardiogram 5/20: Ejection fraction 50 to 55% with cavity mildly dilated.  Doppler parameters consistent with impaired relaxation, there was mildly reduced right ventricular systolic function and the cavity was mildly enlarged there was small circumferential pericardial effusion  ECHO 5/20> LVEF 50-55%. Moderately reduced systolic function. Small pericardial effusion.    reviewed  Micro Data:  BAL 5/21 >> Rare WBC, no organisms seen  Blood cultures 5/20 > NGTD COVID-19 5/20: Negative Brushings cytopathology 5/21> no malignant cells seen, however suspicious  Antimicrobials:  azithromycin 5/21>>>5/25 Ceftriaxone 5/21>>>  Interim history/subjective:  Intermittently agitated at night required some titration of Precedex  Objective   Blood pressure 127/62, pulse 79, temperature 98.4 F (36.9 C), temperature source Oral, resp. rate 18, height _0  (1.88 m), weight 110.4  kg, SpO2 99 %.    Vent Mode:  SIMV/PC/PS;CPAP FiO2 (%):  [40 %] 40 % Set Rate:  [18 bmp] 18 bmp Vt Set:  [650 mL] 650 mL PEEP:  [8 cmH20] 8 cmH20 Pressure Support:  [12 cmH20] 12 cmH20 Plateau Pressure:  [17 cmH20-31 cmH20] 17 cmH20   Intake/Output Summary (Last 24 hours) at 11/25/2018 0823 Last data filed at 11/25/2018 0600 Gross per 24 hour  Intake 4560.62 ml  Output 2350 ml  Net 2210.62 ml   Filed Weights   11/23/18 0145 11/24/18 0443 11/25/18 0426  Weight: 108.9 kg 109.1 kg 110.4 kg   Vent Mode: SIMV/PC/PS;CPAP FiO2 (%):  [40 %] 40 % Set Rate:  [18 bmp] 18 bmp Vt Set:  [650 mL] 650 mL PEEP:  [8 cmH20] 8 cmH20 Pressure Support:  [12 cmH20] 12 cmH20 Plateau Pressure:  [17 cmH20-31 cmH20] 17 cmH20  Examination:  General this is a disheveled 78 year old white male he is currently cycling on pressure support ventilation.  He is interactive and actually looks the best of seen him HEENT normocephalic atraumatic no jugular venous distention mucous membranes are moist Pulmonary: Coarse scattered rhonchi, remains diminished on the right side.  No accessory use.  Able to pull tidal volumes in the 300 cc range on pressure support of 5 and PEEP of 8.  His work of breathing appears acceptable Cardiac: Regular rate and rhythm Abdomen: Soft nontender, liquid stool Extremities: Warm and dry, brisk capillary refill, diffuse anasarca, scattered areas of ecchymosis Neuro: Awake, interactive, nods appropriately, moves all extremities. GU: Clear yellow   Resolved Hospital Problem list     Assessment & Plan:   Acute respiratory failure with hypoxia and hypercarbia -in setting of R sided atelectasis/postobstructive pneumonia. Portable chest x-ray personally reviewed: Improved aeration right base after titration of PEEP and escalation of pulmonary hygiene measures.  Still densely consolidated right upper lobe I am not convinced we can ever recruit this -Repeat sputum culture still pending,  no fever spikes -Work of breathing and tidal volume acceptable on spontaneous breathing trial Plan Day #7 of  7 ceftriaxone Will talk to family today and reconfirm but I think we have a window of opportunity for extubation looking at his current mechanics Continue current spontaneous breathing trial, hopefully extubate later today, this will be a one-way extubation with no plan to reintubate Continue scheduled bronchodilators and hypertonic saline as well as pulmonary hygiene interventions He will most likely need hospice consultation/palliative care Chest x-ray in a.m. 4/16  Acute diastolic heart failure  Paroxysmal atrial fibrillation  -Echocardiogram shows normal EF, decreased RV SF, small pericardial effusion -Home med: Tenormin Cardiology consulted 5/23 for A fib, appreciate recs  Plan Continuing amiodarone and heparin  Continue telemetry monitoring   Acute metabolic encephalopathy in setting of hypoxia, possible infection Plan RA SS goal 0 Titrate Precedex Delirium precautions  History of non-small cell lung cancer Possible recurrence of metastatic disease Cytology report: Nondiagnostic however "suspicious for malignancy" Plan We will need outpatient PET scan if he survives this hospitalization  AKI on CKD 3  -Creatinine stable Plan No NSAIDs given poor renal function  Continue to hold Lasix  Avoid hypotension  Repeat a.m. chemistry   Fluid and electrolyte imbalance: Hypernatremia, hyperchloremia Added free water via tube yesterday, sodium still climbing, currently has water deficit calculates to: ~ 6.2 liters, sodium falling nicely from 156 down to 153 Creatinine improved bicarbonate stable Plan Continue current free water at 75 cc an hour  Trend chemistry   Protein calorie malnutrition Plan Feeds on hold pending extubation  DM2 with acute hyperglycemia, he remains quite hyperglycemic, however this is improved some with current adjustments Plan He remains  hyperglycemic, will continue Lantus at 30, continue aspart as well.  We will hold off on titration of Lantus in favor of aspart titration given pending extubation and n.p.o. status  For now continue current regimen with sliding scale     Best practice:  Diet: EN Pain/Anxiety/Delirium protocol (if indicated): Precedex, PRN fent VAP protocol (if indicated): Bundle in place DVT prophylaxis: Unfractionated heparin GI prophylaxis: Famotidine Glucose control: Lantus, SSI  Mobility: Bedrest Code Status: Partial (no CPR, No CV)  Family Communication: Pending  Disposition: ICU He is more awake today.  Remains critically ill due to his need for titration of mechanical ventilation.  I do think we have a window of opportunity here looking at his exam and his ventilator mechanics.  I will reach out to the family today.  I think we have an opportunity for extubation.  I am concerned about his long-term prognosis.  It seems as though his cancer is back and he has significant postobstructive atelectasis, we have not been able to recruit that right upper lobe.  I am a little concerned that off from PEEP he may collapse once again further.  I will appear the family that we may need to transition to comfort/palliative approach depending on his tolerance of extubation  My cct 31  min  Erick Colace ACNP-BC [place] Pager # [phone] OR # [phone] if no answer

## 2018-11-26 ENCOUNTER — Inpatient Hospital Stay (HOSPITAL_COMMUNITY): Payer: Medicare Other

## 2018-11-26 LAB — CBC
HCT: 40.8 % (ref 39.0–52.0)
Hemoglobin: 10.9 g/dL — ABNORMAL LOW (ref 13.0–17.0)
MCH: 27.5 pg (ref 26.0–34.0)
MCHC: 26.7 g/dL — ABNORMAL LOW (ref 30.0–36.0)
MCV: 103 fL — ABNORMAL HIGH (ref 80.0–100.0)
Platelets: 180 10*3/uL (ref 150–400)
RBC: 3.96 MIL/uL — ABNORMAL LOW (ref 4.22–5.81)
RDW: 16.8 % — ABNORMAL HIGH (ref 11.5–15.5)
WBC: 6.4 10*3/uL (ref 4.0–10.5)
nRBC: 0 % (ref 0.0–0.2)

## 2018-11-26 LAB — COMPREHENSIVE METABOLIC PANEL
ALT: 26 U/L (ref 0–44)
AST: 23 U/L (ref 15–41)
Albumin: 2.2 g/dL — ABNORMAL LOW (ref 3.5–5.0)
Alkaline Phosphatase: 106 U/L (ref 38–126)
Anion gap: 10 (ref 5–15)
BUN: 79 mg/dL — ABNORMAL HIGH (ref 8–23)
CO2: 34 mmol/L — ABNORMAL HIGH (ref 22–32)
Calcium: 8.9 mg/dL (ref 8.9–10.3)
Chloride: 107 mmol/L (ref 98–111)
Creatinine, Ser: 2.59 mg/dL — ABNORMAL HIGH (ref 0.61–1.24)
GFR calc Af Amer: 27 mL/min — ABNORMAL LOW (ref >60)
GFR calc non Af Amer: 23 mL/min — ABNORMAL LOW (ref >60)
Glucose, Bld: 153 mg/dL — ABNORMAL HIGH (ref 70–99)
Potassium: 4.6 mmol/L (ref 3.5–5.1)
Sodium: 151 mmol/L — ABNORMAL HIGH (ref 135–145)
Total Bilirubin: 0.2 mg/dL — ABNORMAL LOW (ref 0.3–1.2)
Total Protein: 6.7 g/dL (ref 6.5–8.1)

## 2018-11-26 LAB — CULTURE, RESPIRATORY W GRAM STAIN: Culture: NORMAL

## 2018-11-26 LAB — HEPARIN LEVEL (UNFRACTIONATED): Heparin Unfractionated: 0.49 IU/mL (ref 0.30–0.70)

## 2018-11-26 LAB — GLUCOSE, CAPILLARY: Glucose-Capillary: 137 mg/dL — ABNORMAL HIGH (ref 70–99)

## 2018-11-26 MED ORDER — MORPHINE SULFATE (PF) 2 MG/ML IV SOLN
1.0000 mg | INTRAVENOUS | Status: DC | PRN
  Administered 2018-11-26 (×2): 2 mg via INTRAVENOUS
  Administered 2018-11-26: 1 mg via INTRAVENOUS
  Filled 2018-11-26 (×2): qty 1

## 2018-11-26 MED ORDER — DIPHENHYDRAMINE HCL 12.5 MG/5ML PO ELIX
12.5000 mg | ORAL_SOLUTION | Freq: Once | ORAL | Status: AC | PRN
  Administered 2018-11-26: 03:00:00 12.5 mg via ORAL
  Filled 2018-11-26: qty 5

## 2018-11-26 MED ORDER — MORPHINE 100MG IN NS 100ML (1MG/ML) PREMIX INFUSION
8.0000 mg/h | INTRAVENOUS | Status: DC
  Administered 2018-11-26: 10:00:00 8 mg/h via INTRAVENOUS
  Filled 2018-11-26: qty 100

## 2018-11-26 MED ORDER — ACETYLCYSTEINE 20 % IN SOLN: 2.0000 mL | Freq: Two times a day (BID) | RESPIRATORY_TRACT | Status: DC

## 2018-11-30 NOTE — Progress Notes (Signed)
NAME:  Edward Hahn, MRN:  [id number], DOB:  22-Mar-1941, LOS: 8 ADMISSION DATE:  11/22/2018, CONSULTATION DATE:  11/19/2018 REFERRING MD:  ED [facility], CHIEF COMPLAINT: Respiratory failure  Brief History   Patient unable to communicate, history from records from [facility].  78 year old man presented with increased shortness of breath and generalized weakness.  Found to have hypercarbic respiratory failure.  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 Coronary artery disease and known congestive heart failure and recent admission for non-ST elevation MI. Significant Hospital Events   Transferred to [facility] 5/20 after presenting to outside hospital with right lung opacification, and respiratory failure 5/21: Bronchoscopy performed at bedside for diagnostic evaluation of the airway.  "Thick inspissated mucus seen in right mainstem bronchus.  Took multiple passes in multiple lavages to suction this out in pieces.  Mucous plugs suctioned out.  Bronchi could be better visualized now.  Right upper lobe bronchus appeared to be distally blocked?  Old scarring brushings obtained from this area.  Right lower lobe bronchi appeared open once mucus suctioned".  Remains sedated for intermittent agitation.  Started ceftriaxone and azithromycin for possible community acquired pneumonia, diuresis initiated for possible element of diastolic dysfunction.  Also concerned about possible recurrence of non-small cell lung cancer 5/22: Wound ostomy nurse consulted for traumatic injury to left foot toes #2 and 3 felt secondary to possiby pressure from  shoes.  Also had moisture injury on coccyx 5/23 cardiology consulted for new A fib >  NSR same day. Started heparin gtt for anticoagulation  5/25: Weaning.  Looking better.  Still with significant right upper lobe collapse.  Adding Meta neb. 5/26: More agitated during the evening hours, no fever spike, no change in white blood cell count, endotracheal tube had to be repositioned, was advanced a bit too far.  Chest x-ray comparing prior film showed worsening significantly on the right, repeating sputum culture, adding hypertonic nebulizers 5/27: Chest x-ray improved, cycling on pressure support ventilation.  Working towards one-way extubation 5/28: Worsening respiratory distress intolerant of BiPAP family notified, transitioning to comfort Consults:  PCCM Cardiology  Procedures:  CTc hest 5/20 >> right hilar lymphadenopathy, completely collapsed/consolidated right lung bronchoscopy  5/21 copious thick secretions/  brushings obtained from right upper lobe  Significant Diagnostic Tests:  CXR 5/20 - Right lung opacification. ETT in position. (personally reviewed) - unchanged according to reports from [facility]. Echocardiogram 5/20: Ejection fraction 50 to 55% with cavity mildly dilated.  Doppler parameters consistent with impaired relaxation, there was mildly reduced right ventricular systolic function and the cavity was mildly enlarged there was small circumferential pericardial effusion  ECHO 5/20> LVEF 50-55%. Moderately reduced systolic function. Small pericardial effusion.    reviewed  Micro Data:  BAL 5/21 >> Rare WBC, no organisms seen  Blood cultures 5/20 > NGTD COVID-19 5/20: Negative Brushings cytopathology 5/21> no malignant cells seen, however suspicious  Antimicrobials:  azithromycin 5/21>>>5/25 Ceftriaxone 5/21>>> 5/27  Interim history/subjective:  Currently on high flow oxygen  Objective   Blood pressure (Abnormal) 103/91, pulse (Abnormal) 108, temperature 97.9 F  (36.6 C), temperature source Axillary, resp. rate (Abnormal) 25, height _0  (1.88 m),  weight 113.3 kg, SpO2 95 %.    Vent Mode: Other (Comment) FiO2 (%):  [50 %] 50 % Set Rate:  [14 bmp] 14 bmp PEEP:  [8 cmH20] 8 cmH20   Intake/Output Summary (Last 24 hours) at 2018/12/10 0737 Last data filed at Dec 10, 2018 0600 Gross per 24 hour  Intake 2315.34 ml  Output 1870 ml  Net 445.34 ml   Filed Weights   11/24/18 0443 11/25/18 0426 2018/12/10 0500  Weight: 109.1 kg 110.4 kg 113.3 kg   Vent Mode: Other (Comment) FiO2 (%):  [50 %] 50 % Set Rate:  [14 bmp] 14 bmp PEEP:  [8 cmH20] 8 cmH20  Examination:  General: This is a 78 year old white male patient he is awake, restless, demonstrating worsening work of breathing and distress.  He was intolerant of BiPAP, appears a little more comfortable on high flow oxygen HEENT normocephalic atraumatic no jugular venous distention mucous membranes are dry Pulmonary: Scattered rhonchi poor cough mechanics diminished throughout the entire right hemithorax Cardiac: Tachycardic regular irregular no murmur rub or gallop is appreciated Abdomen: Soft nontender no organomegaly Extremities: Warm and dry, dependent edema scattered areas of ecchymosis Neuro: Awake, confused, moves all extremities does follow commands  Resolved Hospital Problem list     Assessment & Plan:   Acute respiratory failure with hypoxia and hypercarbia -in setting of R sided atelectasis/postobstructive pneumonia. -Worsening respiratory failure this morning, placed on BiPAP which he did not tolerate Portable chest x-ray personally reviewed: Once again has complete opacification of the right hemithorax consistent with worsening atelectasis/collapse from poor mucus clearance Plan Continuing supplemental oxygen Discontinuing BiPAP due to intolerance I have left a message with family, will add PRN morphine, will try to orchestrate getting them here.  It is time to transition to comfort   Acute diastolic heart failure  Paroxysmal atrial fibrillation  -Echocardiogram shows normal EF, decreased RV SF, small pericardial effusion -Home med: Tenormin Cardiology consulted 5/23 for A fib, appreciate recs  Plan Stop heparin Not able to take p.o.'s Continue telemetry  Acute metabolic encephalopathy in setting of hypoxia, possible infection Plan Supportive care  History of non-small cell lung cancer Possible recurrence of metastatic disease Cytology report: Nondiagnostic however "suspicious for malignancy" Plan Initially plan was to get PET scan as outpatient, this is a moot point currently  AKI on CKD 3  -Creatinine stable Plan Holding Lasix Stopping further lab work  Fluid and electrolyte imbalance: Hypernatremia, hyperchloremia Added free water via tube yesterday, sodium still climbing, currently has water deficit calculates to: ~ 6.2 liters, sodium falling nicely from 156 down to 153 Creatinine improved bicarbonate stable Plan Continuing free water at 75 cc an hour We will stop further lab work  Protein calorie malnutrition Plan N.p.o. status  DM2 with acute hyperglycemia, ->glucose trending down since holding TFs Plan Dc basal dosing  SSI (sensitive scale)     Best practice:  Diet: EN Pain/Anxiety/Delirium protocol (if indicated): Precedex, PRN fent VAP protocol (if indicated): Bundle in place DVT prophylaxis: Unfractionated heparin GI prophylaxis: Famotidine Glucose control: Lantus, SSI  Mobility: Bedrest Code Status: full DNR Family Communication: Pending  Disposition: ICU He is worse.  His entire right hemithorax is once again opacified, I was worried this would happen.  He is dying as a consequence of this postobstructive process.  He is intolerant to BiPAP.  I have notified his son Annie Main.  They are in route to the hospital.  We will be transitioning to full comfort   Critical care x36 minutes  Erick Colace ACNP-BC [place] Pager # [phone] OR # [phone] if no answer

## 2018-11-30 NOTE — Progress Notes (Signed)
Patient pronounced dead at 12:55P. Strip printed. MD Mannam aware. Family at bedside.

## 2018-11-30 NOTE — Progress Notes (Signed)
Family at bedside. Comfort care measures now.

## 2018-11-30 NOTE — Progress Notes (Signed)
Pt was taken off of bipap and placed on 6 L [place] per MD. Pt is stable at this time.

## 2018-11-30 NOTE — Progress Notes (Signed)
eLink Physician-Brief Progress Note Patient Name: Edward Hahn DOB: 06-09-1941 MRN: [id number]   Date of Service  2018/12/03  HPI/Events of Note  Patient restless and unable to sleep  eICU Interventions  Benadryl 12.5 mg ordered. Avoiding anything too sedating so as not to cause respiratory depression     Intervention Category Minor Interventions: Agitation / anxiety - evaluation and management  Judd Lien 12-03-2018, 2:58 AM

## 2018-11-30 NOTE — Progress Notes (Signed)
eLink Physician-Brief Progress Note Patient Name: Klye Besecker DOB: February 28, 1941 MRN: [id number]   Date of Service  12-13-2018  HPI/Events of Note  Notified of increased O2 requirement, shifted to BiPap. Reportedly with poor cough and presence of secretions.  eICU Interventions  Ordered mucomyst neb to loosen phlegm. May do NT suctioning     Intervention Category Major Interventions: Respiratory failure - evaluation and management  Judd Lien 2018-12-13, 5:53 AM

## 2018-11-30 NOTE — Discharge Summary (Signed)
Physician Death Summary  Patient ID: Edward Hahn MRN: [id number] DOB/AGE: Aug 26, 1940 78 y.o.  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  Presented with increased shortness of breath and generalized weakness. Found to have hypercarbic respiratory failure.  Intubated after failed trial of BiPAP.with PCO2 up to 99. Underwent bronchoscopy on 5/21 which showed blockage of the right upper lobe bronchus.  Brushing showed findings suspicious for malignancy.  He continued to do poorly with respiratory failure, new onset atrial fibrillation, delirium. Made DNR and extubated on 5/27. However he continued to deteriorate through the night.  Discussed with family and made comfort measures only.  Morphine drip was started for comfort.  He passed away later in the day on 5/28   Signed: Lakela Kuba 12-24-2018, 1:13 PM

## 2018-11-30 NOTE — Progress Notes (Signed)
Nutrition Brief Note  Chart reviewed. Pt now transitioning to comfort care. NPO.  No further nutrition interventions warranted at this time.  Please re-consult as needed.   Kerman Passey MS, RD, [place], [place] [phone] Pager  [phone] Weekend/On-Call Pager

## 2018-11-30 NOTE — Progress Notes (Signed)
Pt's O2 sats declining, residing in the low 80's. Called RT. Replaced HFNC with venturi mask. No improvement. Placed non-rebreather mask on until RT could locate BIPAP. O2 sats now high 90's and holding. Will continue to monitor.   Weldon Inches, RN

## 2018-11-30 DEATH — deceased

## 2021-02-26 IMAGING — DX PORTABLE CHEST - 1 VIEW
1 series · 1 of 1 positions shown · non-contrast
Comparison: November 21, 2018 chest radiograph and chest CT November 18, 2018

CLINICAL DATA: Hypoxia

EXAM:
PORTABLE CHEST 1 VIEW

[chest ap]
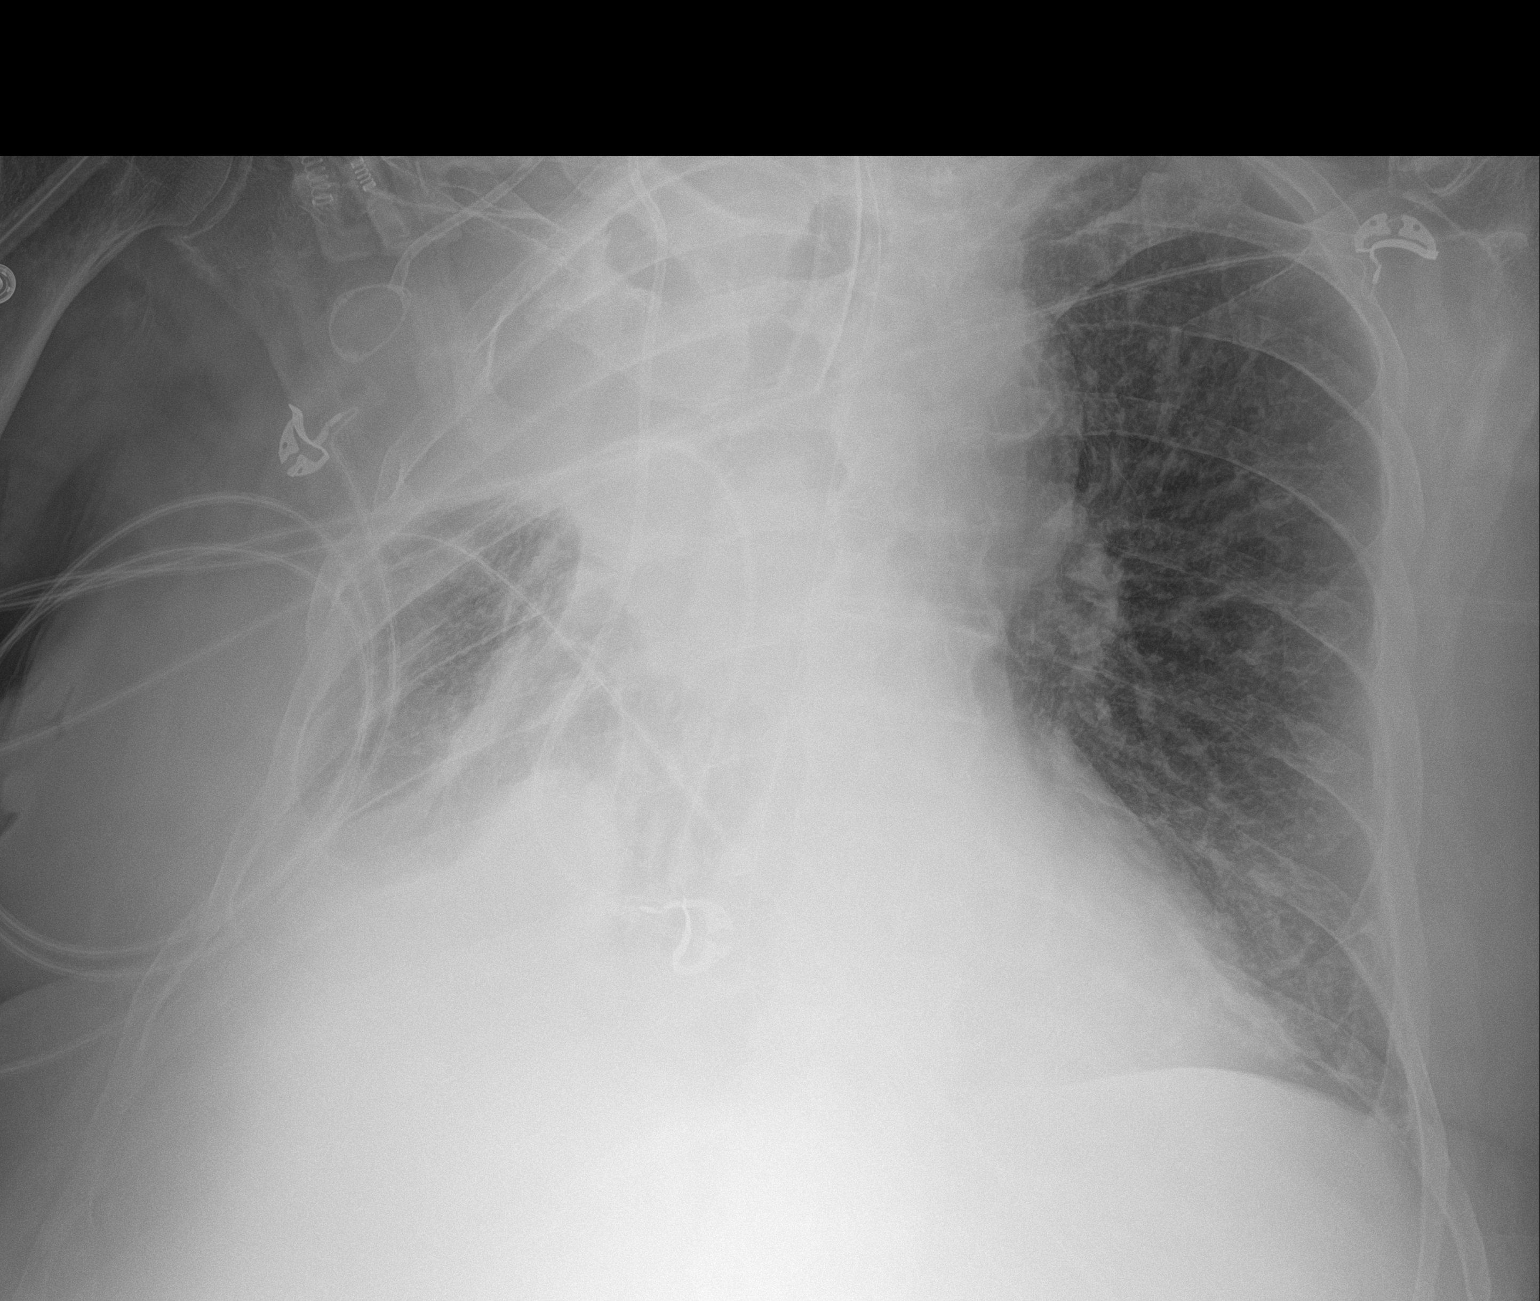

[1 of 1 positions shown; findings below may reference images not displayed]

FINDINGS: Endotracheal tube tip is 1.1 cm above the carina. Nasogastric tube
tip and side port are below the diaphragm. Port-A-Cath tip is in the
right atrium near the expected level of the tricuspid valve. No
pneumothorax. There remains extensive consolidation throughout much
of the right lung, particularly throughout the right upper lobe with
volume loss. No new opacity is evident on the right. There is
atelectatic change in the left base. Left lung is otherwise clear.
The heart size is normal. Pulmonary vascularity on the left appears
normal. Pulmonary vascularity on the right is obscured. There is
aortic atherosclerosis. No adenopathy is seen in areas that can be
assessed for potential adenopathy. No demonstrable bone lesions.
IMPRESSION: Tube and catheter positions as described without pneumothorax. Note
that the endotracheal tube tip is near the carina; it may be prudent
to consider withdrawing endotracheal tube approximately 2-3 cm.

Extensive consolidation throughout much of the right lung is stable
compared to most recent study. Compared to recent CT, there has been
partial re-expansion of a portion of the right mid lower lung zone.
There is mild left base atelectasis. Stable cardiac silhouette.
Aortic Atherosclerosis (FCBPT-LPH.H).
